# Patient Record
Sex: Female | Born: 1960
Health system: Southern US, Community
[De-identification: ages and names within clinical notes are randomized; demographics above are authoritative.]

## PROBLEM LIST (undated history)

## (undated) DIAGNOSIS — E785 Hyperlipidemia, unspecified: Secondary | ICD-10-CM

## (undated) DIAGNOSIS — T7840XA Allergy, unspecified, initial encounter: Secondary | ICD-10-CM

## (undated) DIAGNOSIS — G5600 Carpal tunnel syndrome, unspecified upper limb: Secondary | ICD-10-CM

## (undated) DIAGNOSIS — M1990 Unspecified osteoarthritis, unspecified site: Secondary | ICD-10-CM

## (undated) DIAGNOSIS — Z720 Tobacco use: Secondary | ICD-10-CM

## (undated) DIAGNOSIS — M797 Fibromyalgia: Secondary | ICD-10-CM

## (undated) DIAGNOSIS — F419 Anxiety disorder, unspecified: Secondary | ICD-10-CM

## (undated) DIAGNOSIS — F329 Major depressive disorder, single episode, unspecified: Secondary | ICD-10-CM

## (undated) DIAGNOSIS — E876 Hypokalemia: Secondary | ICD-10-CM

## (undated) DIAGNOSIS — N12 Tubulo-interstitial nephritis, not specified as acute or chronic: Secondary | ICD-10-CM

## (undated) DIAGNOSIS — F32A Depression, unspecified: Secondary | ICD-10-CM

## (undated) DIAGNOSIS — K219 Gastro-esophageal reflux disease without esophagitis: Secondary | ICD-10-CM

## (undated) HISTORY — DX: Depression, unspecified: F32.A

## (undated) HISTORY — DX: Anxiety disorder, unspecified: F41.9

## (undated) HISTORY — PX: LEG SURGERY: SHX1003

## (undated) HISTORY — PX: FRACTURE SURGERY: SHX138

## (undated) HISTORY — DX: Hypokalemia: E87.6

## (undated) HISTORY — DX: Fibromyalgia: M79.7

## (undated) HISTORY — DX: Tubulo-interstitial nephritis, not specified as acute or chronic: N12

## (undated) HISTORY — DX: Carpal tunnel syndrome, unspecified upper limb: G56.00

## (undated) HISTORY — DX: Hyperlipidemia, unspecified: E78.5

## (undated) HISTORY — DX: Gastro-esophageal reflux disease without esophagitis: K21.9

## (undated) HISTORY — DX: Allergy, unspecified, initial encounter: T78.40XA

## (undated) HISTORY — DX: Tobacco use: Z72.0

## (undated) HISTORY — DX: Unspecified osteoarthritis, unspecified site: M19.90

## (undated) HISTORY — DX: Major depressive disorder, single episode, unspecified: F32.9

---

## 1993-11-06 HISTORY — PX: HIP FRACTURE SURGERY: SHX118

## 2000-01-27 ENCOUNTER — Other Ambulatory Visit: Admission: RE | Admit: 2000-01-27 | Discharge: 2000-01-27 | Payer: Self-pay | Admitting: Family Medicine

## 2003-03-25 ENCOUNTER — Other Ambulatory Visit: Admission: RE | Admit: 2003-03-25 | Discharge: 2003-03-25 | Payer: Self-pay | Admitting: Family Medicine

## 2003-03-31 ENCOUNTER — Encounter: Payer: Self-pay | Admitting: Family Medicine

## 2003-03-31 ENCOUNTER — Ambulatory Visit (HOSPITAL_COMMUNITY): Admission: RE | Admit: 2003-03-31 | Discharge: 2003-03-31 | Payer: Self-pay | Admitting: Family Medicine

## 2004-11-06 DIAGNOSIS — N12 Tubulo-interstitial nephritis, not specified as acute or chronic: Secondary | ICD-10-CM

## 2004-11-06 HISTORY — DX: Tubulo-interstitial nephritis, not specified as acute or chronic: N12

## 2005-05-16 ENCOUNTER — Ambulatory Visit: Payer: Self-pay | Admitting: Family Medicine

## 2005-06-26 ENCOUNTER — Ambulatory Visit: Payer: Self-pay | Admitting: Family Medicine

## 2005-07-19 ENCOUNTER — Ambulatory Visit: Payer: Self-pay | Admitting: Internal Medicine

## 2005-07-19 ENCOUNTER — Inpatient Hospital Stay (HOSPITAL_COMMUNITY): Admission: EM | Admit: 2005-07-19 | Discharge: 2005-07-21 | Payer: Self-pay | Admitting: Emergency Medicine

## 2005-07-24 ENCOUNTER — Ambulatory Visit: Payer: Self-pay | Admitting: Family Medicine

## 2005-08-23 ENCOUNTER — Ambulatory Visit: Payer: Self-pay | Admitting: Family Medicine

## 2006-05-14 ENCOUNTER — Ambulatory Visit: Payer: Self-pay | Admitting: Family Medicine

## 2007-08-16 ENCOUNTER — Telehealth: Payer: Self-pay | Admitting: Family Medicine

## 2007-09-04 ENCOUNTER — Telehealth: Payer: Self-pay | Admitting: Family Medicine

## 2007-12-03 ENCOUNTER — Telehealth: Payer: Self-pay | Admitting: Family Medicine

## 2008-02-25 ENCOUNTER — Telehealth: Payer: Self-pay | Admitting: Family Medicine

## 2008-03-27 ENCOUNTER — Telehealth: Payer: Self-pay | Admitting: Family Medicine

## 2008-07-21 ENCOUNTER — Telehealth (INDEPENDENT_AMBULATORY_CARE_PROVIDER_SITE_OTHER): Payer: Self-pay | Admitting: *Deleted

## 2008-07-24 ENCOUNTER — Telehealth (INDEPENDENT_AMBULATORY_CARE_PROVIDER_SITE_OTHER): Payer: Self-pay | Admitting: *Deleted

## 2008-08-20 ENCOUNTER — Telehealth: Payer: Self-pay | Admitting: Family Medicine

## 2008-08-26 ENCOUNTER — Ambulatory Visit: Payer: Self-pay | Admitting: Family Medicine

## 2008-08-26 DIAGNOSIS — F329 Major depressive disorder, single episode, unspecified: Secondary | ICD-10-CM

## 2008-08-26 DIAGNOSIS — M797 Fibromyalgia: Secondary | ICD-10-CM | POA: Insufficient documentation

## 2008-08-26 DIAGNOSIS — F17200 Nicotine dependence, unspecified, uncomplicated: Secondary | ICD-10-CM | POA: Insufficient documentation

## 2008-08-26 DIAGNOSIS — F418 Other specified anxiety disorders: Secondary | ICD-10-CM | POA: Insufficient documentation

## 2008-08-26 DIAGNOSIS — R5382 Chronic fatigue, unspecified: Secondary | ICD-10-CM | POA: Insufficient documentation

## 2008-08-28 ENCOUNTER — Telehealth (INDEPENDENT_AMBULATORY_CARE_PROVIDER_SITE_OTHER): Payer: Self-pay | Admitting: *Deleted

## 2008-08-31 LAB — CONVERTED CEMR LAB
AST: 30 units/L (ref 0–37)
Basophils Absolute: 0 10*3/uL (ref 0.0–0.1)
CO2: 29 meq/L (ref 19–32)
Calcium: 9.7 mg/dL (ref 8.4–10.5)
Chloride: 104 meq/L (ref 96–112)
Eosinophils Absolute: 0.2 10*3/uL (ref 0.0–0.7)
GFR calc non Af Amer: 82 mL/min
Glucose, Bld: 122 mg/dL — ABNORMAL HIGH (ref 70–99)
Hemoglobin: 15.3 g/dL — ABNORMAL HIGH (ref 12.0–15.0)
Lymphocytes Relative: 15.7 % (ref 12.0–46.0)
MCHC: 34.9 g/dL (ref 30.0–36.0)
MCV: 91.4 fL (ref 78.0–100.0)
Neutro Abs: 10.9 10*3/uL — ABNORMAL HIGH (ref 1.4–7.7)
Potassium: 3.7 meq/L (ref 3.5–5.1)
RDW: 12.4 % (ref 11.5–14.6)
Sodium: 142 meq/L (ref 135–145)
TSH: 1.55 microintl units/mL (ref 0.35–5.50)
Total Bilirubin: 0.7 mg/dL (ref 0.3–1.2)

## 2008-09-16 ENCOUNTER — Telehealth: Payer: Self-pay | Admitting: Family Medicine

## 2008-10-05 ENCOUNTER — Ambulatory Visit: Payer: Self-pay | Admitting: Family Medicine

## 2008-10-05 LAB — CONVERTED CEMR LAB
Bilirubin Urine: NEGATIVE
Glucose, Urine, Semiquant: NEGATIVE
Ketones, urine, test strip: NEGATIVE
Specific Gravity, Urine: <1.005
pH: 6

## 2008-10-06 ENCOUNTER — Encounter: Payer: Self-pay | Admitting: Family Medicine

## 2008-10-07 ENCOUNTER — Telehealth: Payer: Self-pay | Admitting: Family Medicine

## 2008-10-15 ENCOUNTER — Encounter: Payer: Self-pay | Admitting: Family Medicine

## 2008-12-31 ENCOUNTER — Telehealth: Payer: Self-pay | Admitting: Family Medicine

## 2009-06-15 ENCOUNTER — Telehealth: Payer: Self-pay | Admitting: Family Medicine

## 2009-06-17 ENCOUNTER — Telehealth: Payer: Self-pay | Admitting: Family Medicine

## 2009-09-10 ENCOUNTER — Telehealth: Payer: Self-pay | Admitting: Family Medicine

## 2009-09-29 ENCOUNTER — Telehealth: Payer: Self-pay | Admitting: Family Medicine

## 2009-11-04 ENCOUNTER — Telehealth: Payer: Self-pay | Admitting: Family Medicine

## 2009-11-08 ENCOUNTER — Telehealth: Payer: Self-pay | Admitting: Family Medicine

## 2009-11-23 ENCOUNTER — Telehealth: Payer: Self-pay | Admitting: Family Medicine

## 2009-11-30 ENCOUNTER — Ambulatory Visit: Payer: Self-pay | Admitting: Family Medicine

## 2009-11-30 DIAGNOSIS — K589 Irritable bowel syndrome without diarrhea: Secondary | ICD-10-CM | POA: Insufficient documentation

## 2009-11-30 DIAGNOSIS — E785 Hyperlipidemia, unspecified: Secondary | ICD-10-CM | POA: Insufficient documentation

## 2009-12-03 ENCOUNTER — Encounter (INDEPENDENT_AMBULATORY_CARE_PROVIDER_SITE_OTHER): Payer: Self-pay | Admitting: *Deleted

## 2009-12-03 ENCOUNTER — Telehealth: Payer: Self-pay | Admitting: Family Medicine

## 2009-12-03 LAB — CONVERTED CEMR LAB
AST: 27 units/L (ref 0–37)
Albumin: 4 g/dL (ref 3.5–5.2)
BUN: 5 mg/dL — ABNORMAL LOW (ref 6–23)
Basophils Absolute: 0.1 10*3/uL (ref 0.0–0.1)
CO2: 33 meq/L — ABNORMAL HIGH (ref 19–32)
Calcium: 9.5 mg/dL (ref 8.4–10.5)
Eosinophils Absolute: 0.2 10*3/uL (ref 0.0–0.7)
GFR calc non Af Amer: 114.03 mL/min (ref >60)
Glucose, Bld: 89 mg/dL (ref 70–99)
HCT: 43.7 % (ref 36.0–46.0)
HDL: 43.1 mg/dL (ref >39.00)
Hemoglobin: 14.6 g/dL (ref 12.0–15.0)
Lymphocytes Relative: 21.9 % (ref 12.0–46.0)
Lymphs Abs: 2.3 10*3/uL (ref 0.7–4.0)
MCHC: 33.3 g/dL (ref 30.0–36.0)
Monocytes Relative: 5.6 % (ref 3.0–12.0)
Neutro Abs: 7.2 10*3/uL (ref 1.4–7.7)
Platelets: 346 10*3/uL (ref 150.0–400.0)
RDW: 12.1 % (ref 11.5–14.6)
TSH: 1.37 microintl units/mL (ref 0.35–5.50)
Total Bilirubin: 0.6 mg/dL (ref 0.3–1.2)
Triglycerides: 258 mg/dL — ABNORMAL HIGH (ref 0.0–149.0)

## 2009-12-20 ENCOUNTER — Ambulatory Visit: Payer: Self-pay | Admitting: Family Medicine

## 2009-12-20 DIAGNOSIS — E876 Hypokalemia: Secondary | ICD-10-CM | POA: Insufficient documentation

## 2009-12-21 ENCOUNTER — Encounter: Payer: Self-pay | Admitting: Family Medicine

## 2009-12-21 LAB — CONVERTED CEMR LAB: Potassium: 3.1 meq/L — ABNORMAL LOW (ref 3.5–5.1)

## 2010-01-05 ENCOUNTER — Ambulatory Visit: Payer: Self-pay | Admitting: Family Medicine

## 2010-01-07 LAB — CONVERTED CEMR LAB: Potassium: 4.1 meq/L (ref 3.5–5.1)

## 2010-01-26 ENCOUNTER — Telehealth: Payer: Self-pay | Admitting: Family Medicine

## 2010-03-30 ENCOUNTER — Ambulatory Visit: Payer: Self-pay | Admitting: Family Medicine

## 2010-03-30 ENCOUNTER — Telehealth: Payer: Self-pay | Admitting: Family Medicine

## 2010-03-30 LAB — CONVERTED CEMR LAB
AST: 28 units/L (ref 0–37)
Cholesterol: 295 mg/dL — ABNORMAL HIGH (ref 0–200)
Direct LDL: 207.6 mg/dL

## 2010-04-08 ENCOUNTER — Ambulatory Visit: Payer: Self-pay | Admitting: Family Medicine

## 2010-05-20 ENCOUNTER — Ambulatory Visit: Payer: Self-pay | Admitting: Family Medicine

## 2010-05-24 LAB — CONVERTED CEMR LAB
Albumin: 4 g/dL (ref 3.5–5.2)
BUN: 9 mg/dL (ref 6–23)
CO2: 28 meq/L (ref 19–32)
Calcium: 9.1 mg/dL (ref 8.4–10.5)
Chloride: 109 meq/L (ref 96–112)
Cholesterol: 195 mg/dL (ref 0–200)
HDL: 38.5 mg/dL — ABNORMAL LOW (ref >39.00)
Phosphorus: 3 mg/dL (ref 2.3–4.6)
Potassium: 4 meq/L (ref 3.5–5.1)
Triglycerides: 161 mg/dL — ABNORMAL HIGH (ref 0.0–149.0)
VLDL: 32.2 mg/dL (ref 0.0–40.0)

## 2010-05-30 ENCOUNTER — Emergency Department (HOSPITAL_COMMUNITY): Admission: EM | Admit: 2010-05-30 | Discharge: 2010-05-30 | Payer: Self-pay | Admitting: Emergency Medicine

## 2010-06-27 ENCOUNTER — Telehealth: Payer: Self-pay | Admitting: Family Medicine

## 2010-07-25 ENCOUNTER — Telehealth: Payer: Self-pay | Admitting: Family Medicine

## 2010-08-23 ENCOUNTER — Telehealth: Payer: Self-pay | Admitting: Family Medicine

## 2010-10-07 ENCOUNTER — Ambulatory Visit: Payer: Self-pay | Admitting: Family Medicine

## 2010-10-10 LAB — CONVERTED CEMR LAB
ALT: 28 units/L (ref 0–35)
AST: 24 units/L (ref 0–37)
Albumin: 4.2 g/dL (ref 3.5–5.2)
Alkaline Phosphatase: 73 units/L (ref 39–117)
BUN: 7 mg/dL (ref 6–23)
Basophils Relative: 0.2 % (ref 0.0–3.0)
CO2: 29 meq/L (ref 19–32)
Chloride: 101 meq/L (ref 96–112)
Direct LDL: 137.9 mg/dL
Eosinophils Absolute: 0.4 10*3/uL (ref 0.0–0.7)
Eosinophils Relative: 3.1 % (ref 0.0–5.0)
Glucose, Bld: 95 mg/dL (ref 70–99)
HDL: 41.2 mg/dL (ref >39.00)
Hemoglobin: 13.9 g/dL (ref 12.0–15.0)
Lymphocytes Relative: 21.3 % (ref 12.0–46.0)
MCHC: 34.3 g/dL (ref 30.0–36.0)
Neutro Abs: 8.6 10*3/uL — ABNORMAL HIGH (ref 1.4–7.7)
Potassium: 4.4 meq/L (ref 3.5–5.1)
RBC: 4.5 M/uL (ref 3.87–5.11)
TSH: 1.39 microintl units/mL (ref 0.35–5.50)
Total CHOL/HDL Ratio: 5
Triglycerides: 166 mg/dL — ABNORMAL HIGH (ref 0.0–149.0)
VLDL: 33.2 mg/dL (ref 0.0–40.0)
WBC: 12.3 10*3/uL — ABNORMAL HIGH (ref 4.5–10.5)

## 2010-10-11 ENCOUNTER — Ambulatory Visit: Payer: Self-pay | Admitting: Family Medicine

## 2010-11-01 ENCOUNTER — Ambulatory Visit: Payer: Self-pay | Admitting: Family Medicine

## 2010-11-01 DIAGNOSIS — D72829 Elevated white blood cell count, unspecified: Secondary | ICD-10-CM | POA: Insufficient documentation

## 2010-11-04 LAB — CONVERTED CEMR LAB
Basophils Relative: 0.3 % (ref 0.0–3.0)
Eosinophils Absolute: 0.4 10*3/uL (ref 0.0–0.7)
HCT: 40.5 % (ref 36.0–46.0)
Lymphs Abs: 3.1 10*3/uL (ref 0.7–4.0)
MCHC: 34.2 g/dL (ref 30.0–36.0)
MCV: 90.8 fL (ref 78.0–100.0)
Monocytes Absolute: 0.8 10*3/uL (ref 0.1–1.0)
Neutrophils Relative %: 60.3 % (ref 43.0–77.0)
RBC: 4.46 M/uL (ref 3.87–5.11)

## 2010-12-06 NOTE — Assessment & Plan Note (Signed)
Summary: FOLLOW UP   Vital Signs:  Patient profile:   50 year old female Height:      68.75 inches Weight:      190.75 pounds BMI:     28.48 Temp:     97.8 degrees F oral Pulse rate:   88 / minute Pulse rhythm:   regular BP sitting:   120 / 76  (left arm) Cuff size:   regular  Vitals Entered By: Lewanda Rife LPN (April 08, 980 9:12 AM) CC: follow-up visit   History of Present Illness: wt is up 4 lb with bmi of 28  overall is feeling the same   cholesterol is way up -- with LDL of 207.6 and trig 279 last LDL 179 and was asked to watch diet  diet is not better -- because she is intolerant of a lot of foods  has been eating a lot of chicken - that is good  eats some fruit  some veg -- mainly peas/ lima beans -- no other greens -- bother stomach and she does not like them   she likes meat and potatoes and bread    fibro-- no worse or better   tab-- smoking the same - about 1ppd or less  smoking only outside  thinks it would be nice to quit - but not ready   K is better after suppl   tends to get dizzy if she stands up to quick or gets hot  no loc   mood has been pretty good overall      Allergies: 1)  ! Vioxx 2)  ! * Bextra 3)  ! Voltaren 4)  ! Ibuprofen 5)  ! * Aleve  Past History:  Past Medical History: Last updated: 08/26/2008 fibromyalgia  tabacco abuse depression gerd carpal tunnel syndrome hyperlipidemia hypokalemia   ortho [place] [facility]- [facility]   Past Surgical History: Last updated: 08/26/2008 leg fx - at age 72 with screws placed 1995 L hip fx 06 hosp for pyellonephritis   Family History: Last updated: 08/26/2008 father arthritis, obesity, ? colitis PGF MI Puncle MI  aunt DM MGM DM  niece with DM cousin with leukemia   Social History: Last updated: 08/26/2008 has same sex partner  current smoker disabled from work due to fibromyalgia   Review of Systems General:  Complains of fatigue; denies fever,  loss of appetite, and malaise. Eyes:  Denies blurring and eye irritation. CV:  Denies chest pain or discomfort, palpitations, shortness of breath with exertion, and swelling of feet. Resp:  Denies cough, shortness of breath, and wheezing. GI:  Denies abdominal pain and change in bowel habits. GU:  Denies urinary frequency. MS:  Complains of joint pain, muscle aches, and stiffness; denies joint redness, joint swelling, and muscle weakness. Derm:  Denies itching, lesion(s), poor wound healing, and rash. Neuro:  Denies numbness and tingling. Psych:  mood is fairly stable . Endo:  Denies cold intolerance, excessive thirst, excessive urination, and heat intolerance. Heme:  Denies abnormal bruising and bleeding.  Physical Exam  General:  overweight but generally well appearing   Head:  normocephalic, atraumatic, and no abnormalities observed.   Eyes:  vision grossly intact, pupils equal, pupils round, and pupils reactive to light.  no conjunctival pallor, injection or icterus  Mouth:  pharynx pink and moist.   Neck:  supple with full rom and no masses or thyromegally, no JVD or carotid bruit  Lungs:  diffusely distant bs - without rales or rhonchi  scant wheeze on forced exp only no prolonged exp phase Heart:  Normal rate and regular rhythm. S1 and S2 normal without gallop, murmur, click, rub or other extra sounds. Msk:  No deformity or scoliosis noted of thoracic or lumbar spine.  trigger points diffusely  limited rom SL  Pulses:  R and L carotid,radial,femoral,dorsalis pedis and posterior tibial pulses are full and equal bilaterally Extremities:  No clubbing, cyanosis, edema, or deformity noted with normal full range of motion of all joints.   Neurologic:  sensation intact to light touch, gait normal, and DTRs symmetrical and normal.  no tremor Skin:  Intact without suspicious lesions or rashes Cervical Nodes:  No lymphadenopathy noted Psych:  affect nl - is relatively cheerful  today   Impression & Recommendations:  Problem # 1:  HYPOKALEMIA (ICD-276.8) Assessment Improved  better with supplement re check at 6 wk labs   Orders: Prescription Created Electronically [phone])  Problem # 2:  HYPERLIPIDEMIA (ICD-272.4) Assessment: Deteriorated  this is severe and disc vascular risks with this in combo with smoking pt is unsure she is willing to change her diet  will start zocor 20-- may need to inc after that  update if side eff rev low sat fat diet in detail  lab 6 wk f/u 6 mo  Her updated medication list for this problem includes:    Zocor 20 Mg Tabs (Simvastatin) .Marland Kitchen... 1 by mouth once daily in evening  Labs Reviewed: SGOT: 28 (03/30/2010)   SGPT: 30 (03/30/2010)   HDL:45.40 (03/30/2010), 43.10 (11/30/2009)  Chol:295 (03/30/2010), 256 (11/30/2009)  Trig:279.0 (03/30/2010), 258.0 (11/30/2009)  Orders: Prescription Created Electronically [phone])  Problem # 3:  TOBACCO USE (ICD-305.1) Assessment: Unchanged  discussed in detail risks of smoking, and possible outcomes including COPD, vascular dz, cancer and also respiratory infections/sinus problems  pt is not ready to quit yet - even after disc risks above  will let me know when ready to quit   Orders: Prescription Created Electronically [phone])  Problem # 4:  FIBROMYALGIA (ICD-729.1) Assessment: Unchanged no changes -- overall enc some physical activity as tolerated  no change in medication Her updated medication list for this problem includes:    Ultram 50 Mg Tabs (Tramadol hcl) .Marland Kitchen... 1 by mouth two times a day as needed    Flexeril 10 Mg Tabs (Cyclobenzaprine hcl) .Marland Kitchen... 1 by mouth three times a day as needed  Orders: Prescription Created Electronically [phone])  Complete Medication List: 1)  Ultram 50 Mg Tabs (Tramadol hcl) .Marland Kitchen.. 1 by mouth two times a day as needed 2)  Flexeril 10 Mg Tabs (Cyclobenzaprine hcl) .Marland Kitchen.. 1 by mouth three times a day as needed 3)  Zoloft 100 Mg Tabs (Sertraline  hcl) .Marland Kitchen.. 11/2 by mouth once daily 4)  Omeprazole 20 Mg Cpdr (Omeprazole) .Marland Kitchen.. 1 by mouth  each am 5)  Potassium Chloride Cr 10 Meq Cr-caps (Potassium chloride) .... Take two by mouth daily 6)  Zocor 20 Mg Tabs (Simvastatin) .Marland Kitchen.. 1 by mouth once daily in evening  Patient Instructions: 1)  you can raise your HDL (good cholesterol) by increasing exercise and eating omega 3 fatty acid supplement like fish oil or flax seed oil over the counter 2)  you can lower LDL (bad cholesterol) by limiting saturated fats in diet like red meat, fried foods, egg yolks, fatty breakfast meats, high fat dairy products and shellfish  3)  try to be as active as possilbe  4)  take the zocor 20 mg in evening  5)  if any side eff or problems -- stop med and call  6)  schedule fasting labs in 6 weeks lipid/ast/alt/K 272, renal  7)  follow up with me in about 6 months  Prescriptions: ZOCOR 20 MG TABS (SIMVASTATIN) 1 by mouth once daily in evening  #30 x 11   Entered and Authorized by:   Judith Part MD   Signed by:   Judith Part MD on 04/08/2010   Method used:   [facility]        [facility] [address] [phone]* (retail)       [address]       [place], [place]  [address]       Ph: [phone] or [phone]       Fax: [phone]   RxID:   [id number]   Current Allergies (reviewed today): ! VIOXX ! * BEXTRA ! VOLTAREN ! IBUPROFEN ! * ALEVE

## 2010-12-06 NOTE — Progress Notes (Signed)
Summary: Tramadol HCL 50mg  rx  Phone Note Refill Request Call back at [phone] Message [facility] on July 25, 2010 4:26 PM  Refills Requested: Medication #1:  ULTRAM 50 MG  TABS 1 by mouth two times a day as needed [facility] [address] request Tramadol 50mg  refill. No refill date sent.Please advise.    Method Requested: Telephone to Pharmacy Initial call taken by: Lewanda Rife LPN,  July 25, 2010 4:26 PM  Follow-up for Phone Call        px written on EMR for call in  Follow-up by: Judith Part MD,  July 25, 2010 4:49 PM  Additional Follow-up for Phone Call Additional follow up Details #1::        [facility] as instructed. Lewanda Rife LPN  July 25, 2010 4:56 PM     New/Updated Medications: ULTRAM 50 MG  TABS (TRAMADOL HCL) 1 by mouth two times a day as needed Prescriptions: ULTRAM 50 MG  TABS (TRAMADOL HCL) 1 by mouth two times a day as needed  #60 x 5   Entered and Authorized by:   Judith Part MD   Signed by:   Lewanda Rife LPN on 21/30/8657   Method used:   Telephoned to ...         RxID:   [id number]

## 2010-12-06 NOTE — Progress Notes (Signed)
Summary: Flexeril refill  Phone Note Refill Request Call back at [phone] ([facility]) Message from:  [facility] on June 27, 2010 11:57 AM  Refills Requested: Medication #1:  FLEXERIL 10 MG  TABS 1 by mouth three times a day as needed Ok to refill Flexeril 10mg  1 by mouth three times a day as needed #90?   Method Requested: Electronic Initial call taken by: Janee Morn CMA Duncan Dull),  June 27, 2010 11:58 AM  Follow-up for Phone Call        px written on EMR for call in  Follow-up by: Judith Part MD,  June 27, 2010 1:22 PM  Additional Follow-up for Phone Call Additional follow up Details #1::        [facility] Additional Follow-up by: Janee Morn CMA [facility]ull),  June 27, 2010 1:36 PM    Prescriptions: FLEXERIL 10 MG  TABS (CYCLOBENZAPRINE HCL) 1 by mouth three times a day as needed  #90 x 1   Entered and Authorized by:   Judith Part MD   Signed by:   Judith Part MD on 06/27/2010   Method used:   Telephoned to ...         RxID:   [id number]

## 2010-12-06 NOTE — Progress Notes (Signed)
Summary: Cyclobenzaprine  10mg   Phone Note Refill Request   Refills Requested: Medication #1:  FLEXERIL 10 MG  TABS 1 by mouth three times a day as needed [facility] [address] request refill on Cyclobenzaprine 10mg . No refill date sent. [facility] and [facility] the #90 plus extra refill given on 06/27/10.Please advise.    Method Requested: Telephone to Pharmacy Initial call taken by: Lewanda Rife LPN,  August 23, 2010 3:54 PM  Follow-up for Phone Call        px written on EMR for call in  Follow-up by: Judith Part MD,  August 23, 2010 4:03 PM  Additional Follow-up for Phone Call Additional follow up Details #1::        Rx called to pharmacy Additional Follow-up by: Linde Gillis CMA [facility]),  August 23, 2010 4:09 PM    Prescriptions: FLEXERIL 10 MG  TABS (CYCLOBENZAPRINE HCL) 1 by mouth three times a day as needed  #90 x 2   Entered by:   Linde Gillis CMA (AAMA)   Authorized by:   Judith Part MD   Signed by:   Linde Gillis CMA [facility] Dull) on 08/23/2010   Method used:   Electronically to        [facility] [address] [phone]* (retail)       [address]       [place], [place]  [address]       Ph: [phone] or [phone]       Fax: [phone]   RxID:   [id number]

## 2010-12-06 NOTE — Progress Notes (Signed)
Summary: refill request for flexeril  Phone Note Refill Request Message from:  Fax from Pharmacy  Refills Requested: Medication #1:  FLEXERIL 10 MG  TABS 1 by mouth three times a day prn   Last Refilled: 10/11/2009 [facility] phone [phone].  Initial call taken by: Lowella Petties CMA,  December 03, 2009 10:50 AM  Follow-up for Phone Call        Medication phoned to pharmacy.  Follow-up by: Delilah Shan CMA [facility] Dull),  December 03, 2009 12:32 PM    Prescriptions: FLEXERIL 10 MG  TABS (CYCLOBENZAPRINE HCL) 1 by mouth three times a day prn  #90 Tablet x 0   Entered and Authorized by:   Ruthe Mannan MD   Signed by:   Ruthe Mannan MD on 12/03/2009   Method used:   Handwritten   RxID:   [id number]

## 2010-12-06 NOTE — Miscellaneous (Signed)
Summary: Controlled Substance Agreement  Controlled Substance Agreement   Imported By: Lanelle Bal 04/14/2010 09:05:47  _____________________________________________________________________  External Attachment:    Type:   Image     Comment:   External Document

## 2010-12-06 NOTE — Progress Notes (Signed)
Summary: Tramadol HCL 50mg   Phone Note Refill Request Call back at [phone] Message from:  [facility] [address] on January 26, 2010 11:03 AM  Refills Requested: Medication #1:  ULTRAM 50 MG  TABS 1 by mouth two times a day as needed [facility] electronic refill on Tramadol HCL 50mg . No date sent for last refill. Please advise.    Method Requested: Telephone to Pharmacy Initial call taken by: Lewanda Rife LPN,  January 26, 2010 11:03 AM  Follow-up for Phone Call        px written on EMR for call in  Follow-up by: Judith Part MD,  January 26, 2010 11:56 AM  Additional Follow-up for Phone Call Additional follow up Details #1::        [facility] as instructed. Lewanda Rife LPN  January 26, 2010 1:13 PM     New/Updated Medications: ULTRAM 50 MG  TABS (TRAMADOL HCL) 1 by mouth two times a day as needed Prescriptions: ULTRAM 50 MG  TABS (TRAMADOL HCL) 1 by mouth two times a day as needed  #60 x 5   Entered and Authorized by:   Judith Part MD   Signed by:   Lewanda Rife LPN on 40/98/1191   Method used:   Telephoned to ...         RxID:   [id number]

## 2010-12-06 NOTE — Assessment & Plan Note (Signed)
Summary: FOLLOW UP / LFW  R/S 12/5   Vital Signs:  Patient profile:   50 year old female Height:      68.75 inches Weight:      197 pounds BMI:     29.41 Temp:     98.2 degrees F oral Pulse rate:   96 / minute Pulse rhythm:   regular BP sitting:   114 / 74  (left arm) Cuff size:   regular  Vitals Entered By: Lewanda Rife LPN (October 07, 2010 9:25 AM) CC: six month f/u   History of Present Illness: here for f/u of hyperlipidemia and depression and fibromyalgia/ chronic fatigue   in general doing about the same  wt is up 7 lb with bmi of 29  bp good 114/74  smoking status-- still smoking -- cut down to 1/2 ppd  not serious about a quit date  worried she will gain weight    chol did improve much with low dose zocor  due for a check diet -- more chicken and avoiding red meat  no fried foods at all   mood has been pretty good is taking care of her grandmother on sunday --- needs a lot of patience   pain-- is still sore all over - more persistant than it was  is trying to do some stretching  did injure R ankle after stretching -- and that is better thumbs hurt a lot   some exercise when she can -- inside the house -- cleaning    wants her flu shot     Allergies: 1)  ! Vioxx 2)  ! * Bextra 3)  ! Voltaren 4)  ! Ibuprofen 5)  ! * Aleve  Past History:  Past Medical History: Last updated: 08/26/2008 fibromyalgia  tabacco abuse depression gerd carpal tunnel syndrome hyperlipidemia hypokalemia   ortho [place] [facility]- [facility]   Past Surgical History: Last updated: 08/26/2008 leg fx - at age 66 with screws placed 1995 L hip fx 06 hosp for pyellonephritis   Family History: Last updated: 08/26/2008 father arthritis, obesity, ? colitis PGF MI Puncle MI  aunt DM MGM DM  niece with DM cousin with leukemia   Social History: Last updated: 08/26/2008 has same sex partner  current smoker disabled from work due to  fibromyalgia   Review of Systems General:  Complains of fatigue and sleep disorder; denies chills, fever, and loss of appetite. Eyes:  Denies blurring and eye irritation. CV:  Denies chest pain or discomfort, lightheadness, palpitations, and shortness of breath with exertion. Resp:  Denies cough, shortness of breath, and wheezing. GI:  Denies abdominal pain, change in bowel habits, indigestion, and nausea. GU:  Denies dysuria and urinary frequency. MS:  Denies joint pain and joint swelling. Derm:  Denies itching, lesion(s), poor wound healing, and rash. Neuro:  Denies numbness and tingling. Psych:  Denies anxiety and depression. Endo:  Denies excessive thirst and excessive urination. Heme:  Denies abnormal bruising and bleeding.  Physical Exam  General:  overweight but generally well appearing   Head:  normocephalic, atraumatic, and no abnormalities observed.   Eyes:  vision grossly intact, pupils equal, pupils round, and pupils reactive to light.  no conjunctival pallor, injection or icterus  Mouth:  pharynx pink and moist.   Neck:  supple with full rom and no masses or thyromegally, no JVD or carotid bruit  Chest Wall:  No deformities, masses, or tenderness noted. Lungs:  diffusely distant bs - without  rales or rhonchi scant wheeze on forced exp only no prolonged exp phase Heart:  Normal rate and regular rhythm. S1 and S2 normal without gallop, murmur, click, rub or other extra sounds. Abdomen:  Bowel sounds positive,abdomen soft and non-tender without masses, organomegaly or hernias noted. no renal bruits  Msk:  No deformity or scoliosis noted of thoracic or lumbar spine.  trigger points diffusely  limited rom SL gait is not labored today  Pulses:  R and L carotid,radial,femoral,dorsalis pedis and posterior tibial pulses are full and equal bilaterally Extremities:  No clubbing, cyanosis, edema, or deformity noted with normal full range of motion of all joints.   Neurologic:   sensation intact to light touch, gait normal, and DTRs symmetrical and normal.   Skin:  Intact without suspicious lesions or rashes Cervical Nodes:  No lymphadenopathy noted Inguinal Nodes:  No significant adenopathy Psych:  affect is brighter than usual- talkative  good eye contact/comm skills and sense of humor   Impression & Recommendations:  Problem # 1:  HYPERLIPIDEMIA (ICD-272.4) Assessment Improved  improved on zocor and with better diet lab today rev low sat fat diet and risks of high chol Her updated medication list for this problem includes:    Zocor 20 Mg Tabs (Simvastatin) .Marland Kitchen... 1 by mouth once daily in evening  Orders: Venipuncture (56433) TLB-BMP (Basic Metabolic Panel-BMET) (80048-METABOL) TLB-TSH (Thyroid Stimulating Hormone) (84443-TSH) TLB-Hepatic/Liver Function Pnl (80076-HEPATIC) TLB-CBC Platelet - w/Differential (85025-CBCD) TLB-ALT (SGPT) (84460-ALT) TLB-AST (SGOT) (84450-SGOT) TLB-Lipid Panel (80061-LIPID) Prescription Created Electronically [phone])  Labs Reviewed: SGOT: 20 (05/20/2010)   SGPT: 24 (05/20/2010)   HDL:38.50 (05/20/2010), 45.40 (03/30/2010)  LDL:124 (05/20/2010)  Chol:195 (05/20/2010), 295 (03/30/2010)  Trig:161.0 (05/20/2010), 279.0 (03/30/2010)  Problem # 2:  HYPOKALEMIA (ICD-276.8) Assessment: Unchanged lab today on supplementation Orders: Venipuncture (84166) TLB-BMP (Basic Metabolic Panel-BMET) (80048-METABOL) TLB-TSH (Thyroid Stimulating Hormone) (84443-TSH) TLB-Hepatic/Liver Function Pnl (80076-HEPATIC) TLB-CBC Platelet - w/Differential (85025-CBCD) TLB-ALT (SGPT) (84460-ALT) TLB-AST (SGOT) (84450-SGOT) TLB-Lipid Panel (80061-LIPID) Prescription Created Electronically [phone])  Problem # 3:  DEPRESSION (ICD-311) Assessment: Unchanged  mood is ok on zoloft - good attitude will continue this dose  recommended exercise Her updated medication list for this problem includes:    Zoloft 100 Mg Tabs (Sertraline hcl) .Marland Kitchen... 11/2  by mouth once daily  Orders: Prescription Created Electronically [phone])  Problem # 4:  FIBROMYALGIA (ICD-729.1) Assessment: Unchanged  this is stable  enc to call integrative therapies and see if they take her ins may benefit from some PT and massage Her updated medication list for this problem includes:    Ultram 50 Mg Tabs (Tramadol hcl) .Marland Kitchen... 1 by mouth two times a day as needed    Flexeril 10 Mg Tabs (Cyclobenzaprine hcl) .Marland Kitchen... 1 by mouth three times a day as needed  Orders: Prescription Created Electronically [phone])  Problem # 5:  FATIGUE (ICD-780.79) Assessment: Unchanged stable- chronic lab today Orders: Venipuncture (73220) TLB-BMP (Basic Metabolic Panel-BMET) (80048-METABOL) TLB-TSH (Thyroid Stimulating Hormone) (84443-TSH) TLB-Hepatic/Liver Function Pnl (80076-HEPATIC) TLB-CBC Platelet - w/Differential (85025-CBCD) TLB-ALT (SGPT) (84460-ALT) TLB-AST (SGOT) (84450-SGOT) TLB-Lipid Panel (80061-LIPID) Prescription Created Electronically [phone])  Problem # 6:  TOBACCO USE (ICD-305.1) Assessment: Unchanged  discussed in detail risks of smoking, and possible outcomes including COPD, vascular dz, cancer and also respiratory infections/sinus problems   flu shot today enc to consider quitting   Orders: Prescription Created Electronically [phone])  Complete Medication List: 1)  Ultram 50 Mg Tabs (Tramadol hcl) .Marland Kitchen.. 1 by mouth two times a day as needed 2)  Flexeril 10 Mg  Tabs (Cyclobenzaprine hcl) .Marland Kitchen.. 1 by mouth three times a day as needed 3)  Zoloft 100 Mg Tabs (Sertraline hcl) .Marland Kitchen.. 11/2 by mouth once daily 4)  Omeprazole 20 Mg Cpdr (Omeprazole) .Marland Kitchen.. 1 by mouth  each am 5)  Potassium Chloride Cr 10 Meq Cr-caps (Potassium chloride) .... Take two by mouth daily 6)  Zocor 20 Mg Tabs (Simvastatin) .Marland Kitchen.. 1 by mouth once daily in evening  Other Orders: Admin 1st Vaccine (13086) Flu Vaccine 52yrs + [phone])  Patient Instructions: 1)  Shirlee Limerick- please give pt phone number  to integrative therapies in [place] -- so she can call and get some info 2)  call us if you want a referral  3)  no change in meds  4)  labs today  5)  keep up the better diet 6)  keep thinking about quitting smoking 7)  consider some low impact exercise in the house -- perhaps some videos  8)  flu shot today Prescriptions: ZOCOR 20 MG TABS (SIMVASTATIN) 1 by mouth once daily in evening  #30 x 11   Entered and Authorized by:   Judith Part MD   Signed by:   Judith Part MD on 10/07/2010   Method used:   [facility]        [facility] [address] [phone]* (retail)       [address]       [place], [place]  [address]       Ph: [phone] or [phone]       Fax: [phone]   RxID:   [id number] POTASSIUM CHLORIDE CR 10 MEQ CR-CAPS (POTASSIUM CHLORIDE) take two by mouth daily  #60 x 11   Entered and Authorized by:   Judith Part MD   Signed by:   Judith Part MD on 10/07/2010   Method used:   Electronically to        [facility] [address] [phone]* (retail)       [address]       [place], [place]  [address]       Ph: [phone] or [phone]       Fax: [phone]   RxID:   [id number] OMEPRAZOLE 20 MG CPDR (OMEPRAZOLE) 1 by mouth  each am  #30 x 11   Entered and Authorized by:   Judith Part MD   Signed by:   Judith Part MD on 10/07/2010   Method used:   Electronically to        [facility] [address] [phone]* (retail)       [address]       [place], [place]  [address]       Ph: [phone] or [phone]       Fax: [phone]   RxID:   [id number] ZOLOFT 100 MG  TABS (SERTRALINE HCL) 11/2 by mouth once daily  #45 x 11   Entered and Authorized by:   Judith Part MD   Signed by:   Judith Part MD on 10/07/2010   Method used:   Electronically to        [facility] [address] [phone]* (retail)       [address]       [place], [place]  [address]  Ph: [phone] or [phone]       Fax: [phone]   RxID:   [id number] FLEXERIL 10 MG  TABS (CYCLOBENZAPRINE HCL) 1 by mouth three times a day as needed  #90 x 5   Entered and Authorized by:   Judith Part MD   Signed by:   Judith Part MD on 10/07/2010   Method used:   Electronically to        [facility] [address] [phone]* (retail)       [address]       [place], [place]  [address]       Ph: [phone] or [phone]       Fax: [phone]   RxID:   [id number]    Orders Added: 1)  Venipuncture [85462] 2)  TLB-BMP (Basic Metabolic Panel-BMET) [80048-METABOL] 3)  TLB-TSH (Thyroid Stimulating Hormone) [84443-TSH] 4)  TLB-Hepatic/Liver Function Pnl [80076-HEPATIC] 5)  TLB-CBC Platelet - w/Differential [85025-CBCD] 6)  TLB-ALT (SGPT) [84460-ALT] 7)  TLB-AST (SGOT) [84450-SGOT] 8)  TLB-Lipid Panel [80061-LIPID] 9)  Admin 1st Vaccine [90471] 10)  Flu Vaccine 46yrs + [70350] 11)  Est. Patient Level IV [09381] 12)  Prescription Created Electronically [W2993]    Current Allergies (reviewed today): ! VIOXX ! * BEXTRA ! VOLTAREN ! IBUPROFEN ! * ALEVE  Flu Vaccine Consent Questions     Do you have a history of severe allergic reactions to this vaccine? no    Any prior history of allergic reactions to egg and/or gelatin? no    Do you have a sensitivity to the preservative Thimersol? no    Do you have a past history of Guillan-Barre Syndrome? no    Do you currently have an acute febrile illness? no    Have you ever had a severe reaction to latex? no    Vaccine information given and explained to patient? yes    Are you currently pregnant? no    Lot Number:[id number]   Exp Date:05/06/2011   Site Given  Left Deltoid IM Lewanda Rife LPN  October 07, 2010 10:14 AM  .Craige Cotta

## 2010-12-06 NOTE — Progress Notes (Signed)
Summary: Rx Flexeril  Phone Note Refill Request Call back at [phone] Message from:  [facility] on Mar 30, 2010 3:11 PM  Refills Requested: Medication #1:  FLEXERIL 10 MG  TABS 1 by mouth three times a day prn Received E-script request please advise.   Method Requested: Electronic Initial call taken by: Linde Gillis CMA Duncan Dull),  Mar 30, 2010 3:11 PM  Follow-up for Phone Call        px written on EMR for call in  Follow-up by: Judith Part MD,  Mar 30, 2010 5:16 PM  Additional Follow-up for Phone Call Additional follow up Details #1::        Medication [facility] pharmacy as instructed. Lewanda Rife LPN  Mar 30, 2010 5:22 PM     New/Updated Medications: FLEXERIL 10 MG  TABS (CYCLOBENZAPRINE HCL) 1 by mouth three times a day prn Prescriptions: FLEXERIL 10 MG  TABS (CYCLOBENZAPRINE HCL) 1 by mouth three times a day prn  #90 x 2   Entered and Authorized by:   Judith Part MD   Signed by:   Lewanda Rife LPN on 54/07/8118   Method used:   Telephoned to ...       [facility] [address] [phone]* (retail)       [address]       [place], [place]  [address]       Ph: [phone] or [phone]       Fax: [phone]   RxID:   [id number]

## 2010-12-06 NOTE — Progress Notes (Signed)
Summary: Rx Tramadol  Phone Note Refill Request Call back at [phone] Message from:  [facility] on November 23, 2009 4:56 PM  Refills Requested: Medication #1:  ULTRAM 50 MG  TABS 1 by mouth two times a day as needed   Last Refilled: 10/23/2009 Received faxed refill request, please advise   Method Requested: Telephone to Pharmacy Initial call taken by: Linde Gillis CMA Duncan Dull),  November 23, 2009 4:57 PM  Follow-up for Phone Call        px written on EMR for call in please schedule f/u to check in this winter if not already planned   Follow-up by: Judith Part MD,  November 23, 2009 5:22 PM  Additional Follow-up for Phone Call Additional follow up Details #1::        [facility]             Lowella Petties CMA  November 23, 2009 5:35 PM  Appt made for next week. Additional Follow-up by: Lowella Petties CMA,  November 24, 2009 9:21 AM    Prescriptions: ULTRAM 50 MG  TABS (TRAMADOL HCL) 1 by mouth two times a day as needed  #60 x 1   Entered and Authorized by:   Judith Part MD   Signed by:   Judith Part MD on 11/23/2009   Method used:   Telephoned to ...         RxID:   [id number]

## 2010-12-06 NOTE — Miscellaneous (Signed)
Summary: Potassium rx  Medications Added POTASSIUM CHLORIDE CR 10 MEQ CR-CAPS (POTASSIUM CHLORIDE) take two by mouth daily       Clinical Lists Changes  Medications: Changed medication from POTASSIUM CHLORIDE CR 10 MEQ CR-CAPS (POTASSIUM CHLORIDE) take one by mouth daily to POTASSIUM CHLORIDE CR 10 MEQ CR-CAPS (POTASSIUM CHLORIDE) take two by mouth daily - Signed Rx of POTASSIUM CHLORIDE CR 10 MEQ CR-CAPS (POTASSIUM CHLORIDE) take two by mouth daily;  #60 x 5;  Signed;  Entered by: Lewanda Rife LPN;  Authorized by: Judith Part MD;  Method used: Electronically to [facility] [address] [phone]*, [address] [place], [place], [place]  [address], Ph: [phone] or [phone], Fax: [phone]    Prescriptions: POTASSIUM CHLORIDE CR 10 MEQ CR-CAPS (POTASSIUM CHLORIDE) take two by mouth daily  #60 x 5   Entered by:   Lewanda Rife LPN   Authorized by:   Judith Part MD   Signed by:   Lewanda Rife LPN on 64/40/3474   Method used:   Electronically to        [facility] [address] [phone]* (retail)       [address]       [place], [place]  [address]       Ph: [phone] or [phone]       Fax: [phone]   RxID:   [id number]   Prior Medications: ULTRAM 50 MG  TABS (TRAMADOL HCL) 1 by mouth two times a day as needed FLEXERIL 10 MG  TABS (CYCLOBENZAPRINE HCL) 1 by mouth three times a day prn ZOLOFT 100 MG  TABS (SERTRALINE HCL) 11/2 by mouth once daily OMEPRAZOLE 20 MG CPDR (OMEPRAZOLE) 1 by mouth  each am POTASSIUM CHLORIDE CR 10 MEQ CR-CAPS (POTASSIUM CHLORIDE) take two by mouth daily Current Allergies: ! VIOXX ! * BEXTRA ! VOLTAREN ! IBUPROFEN ! * ALEVE

## 2010-12-06 NOTE — Assessment & Plan Note (Signed)
Summary: FOLLOW UP   Vital Signs:  Patient profile:   50 year old female Weight:      186 pounds Temp:     98 degrees F oral Pulse rate:   92 / minute Pulse rhythm:   regular BP sitting:   130 / 85  (left arm) Cuff size:   regular  Vitals Entered By: Lowella Petties CMA (November 30, 2009 9:17 AM) CC: follow-up visit   History of Present Illness: here for f/u of chronic med problems   is feeling so/so   wt is down 7 lb from last visit is nauseated a lot - do not know why  a little abd pain now and then -- lower abd -- with constipated and diarrhea  diet is "crappy" -- - others try to make her eat healthy  otherwise she would eat junk food all the time and sweets  this makes her ibs and gerd act up   has been having some back problems- knot on back in mid back   tabacco -- is worse- smoking more  not ready to quit  her partner had side effects on chantix  is routinely coughing up green stuff and gets out of breath more easily with exercise  knows she should quit   fibromyalgia -- is worse with age  not too much exercise- is very active at home insurance no longer covers mobic   due for lipid check  Last Lipid ProfileCholesterol: HDL:  LDL:  Triglycerides:  Last Liver profileSGOT:  30 (08/26/2008 4:07:00 PM)SPGT:  38 (08/26/2008 4:07:00 PM)T. Bili:  0.7 (08/26/2008 4:07:00 PM)Alk Phos:  69 (08/26/2008 4:07:00 PM)   thinks menopause is starting -- with hot flashes  periods are irregular  more irritable    IMMS - is interested in flu and pneumovax       Allergies: 1)  ! Vioxx 2)  ! * Bextra 3)  ! Voltaren 4)  ! Ibuprofen 5)  ! * Aleve  Past History:  Past Medical History: Last updated: 08/26/2008 fibromyalgia  tabacco abuse depression gerd carpal tunnel syndrome hyperlipidemia hypokalemia   ortho [place] [facility]   Past Surgical History: Last updated: 08/26/2008 leg fx - at age 28 with screws placed 1995 L hip fx 06  hosp for pyellonephritis   Family History: Last updated: 08/26/2008 father arthritis, obesity, ? colitis PGF MI Puncle MI  aunt DM MGM DM  niece with DM cousin with leukemia   Social History: Last updated: 08/26/2008 has same sex partner  current smoker disabled from work due to fibromyalgia   Review of Systems General:  Complains of fatigue; denies fever, loss of appetite, and malaise. Eyes:  Denies blurring and eye pain. CV:  Complains of shortness of breath with exertion; denies chest pain or discomfort and lightheadness. Resp:  Denies cough and wheezing. GI:  Complains of constipation, diarrhea, and indigestion; denies bloody stools and vomiting. GU:  Denies discharge, dysuria, and urinary frequency. MS:  Complains of joint pain, muscle aches, and stiffness; denies muscle weakness. Derm:  Denies itching, lesion(s), poor wound healing, and rash. Neuro:  Denies numbness and tingling. Psych:  Complains of depression; denies suicidal thoughts/plans. Endo:  Denies cold intolerance, excessive thirst, excessive urination, and heat intolerance. Heme:  Denies abnormal bruising and bleeding.  Physical Exam  General:  overweight but generally well appearing   Head:  normocephalic, atraumatic, and no abnormalities observed.   Eyes:  vision grossly intact, pupils equal, pupils round, and pupils  reactive to light.  no conjunctival pallor, injection or icterus  Ears:  R ear normal and L ear normal.   Nose:  no nasal discharge.   Mouth:  pharynx pink and moist.   Neck:  supple with full rom and no masses or thyromegally, no JVD or carotid bruit  Chest Wall:  No deformities, masses, or tenderness noted. Lungs:  diffusely distant bs - without rales or rhonchi scant wheeze on forced exp only no prolonged exp phase Heart:  Normal rate and regular rhythm. S1 and S2 normal without gallop, murmur, click, rub or other extra sounds. Abdomen:  Bowel sounds positive,abdomen soft and  non-tender without masses, organomegaly or hernias noted. no renal bruits  Msk:  No deformity or scoliosis noted of thoracic or lumbar spine.  trigger points diffusely  limited rom SL nl rom ankles without swelling Pulses:  R and L carotid,radial,femoral,dorsalis pedis and posterior tibial pulses are full and equal bilaterally Extremities:  No clubbing, cyanosis, edema, or deformity noted with normal full range of motion of all joints.   Neurologic:  sensation intact to light touch, gait normal, and DTRs symmetrical and normal.  no tremor Skin:  Intact without suspicious lesions or rashes Cervical Nodes:  No lymphadenopathy noted Inguinal Nodes:  No significant adenopathy Psych:  seems down and negative today  good eye contact - is still animated   Impression & Recommendations:  Problem # 1:  HYPERLIPIDEMIA (ICD-272.4) Assessment Unchanged diet is a little better lately  rev low sat fat diet with more fiber  Orders: Venipuncture (52841) TLB-Lipid Panel (80061-LIPID)  Problem # 2:  IBS (ICD-564.1) Assessment: New with intermittent constipation and diarrhea  adv starting fiber suppl and higher fiber diet / exercise disc imp of water intake try citrucel and update  Problem # 3:  DEPRESSION (ICD-311) Assessment: Unchanged ongoing - more irritable with menopause  given option to inc zoloft in future if needed  also rec counseling if she can afford in future disc imp of exercise Her updated medication list for this problem includes:    Zoloft 100 Mg Tabs (Sertraline hcl) .Marland Kitchen... 11/2 by mouth once daily  Problem # 4:  FIBROMYALGIA (ICD-729.1) Assessment: Deteriorated pt had to stop nsaid due to cost -- will check with ins about approved opt or prior auth will ref to Dr Patsy Lager for ongoing focal back and ankle pain  The following medications were removed from the medication list:    Mobic 15 Mg Tabs (Meloxicam) .Marland Kitchen... 1 by mouth once daily with food as needed for pain Her updated  medication list for this problem includes:    Ultram 50 Mg Tabs (Tramadol hcl) .Marland Kitchen... 1 by mouth two times a day as needed    Flexeril 10 Mg Tabs (Cyclobenzaprine hcl) .Marland Kitchen... 1 by mouth three times a day prn  Problem # 5:  TOBACCO USE (ICD-305.1) Assessment: Unchanged discussed in detail risks of smoking, and possible outcomes including COPD, vascular dz, cancer and also respiratory infections/sinus problems   pt is not ready to quit yet- but voiced understanding flu shot and pneumovax today   Complete Medication List: 1)  Ultram 50 Mg Tabs (Tramadol hcl) .Marland Kitchen.. 1 by mouth two times a day as needed 2)  Flexeril 10 Mg Tabs (Cyclobenzaprine hcl) .Marland Kitchen.. 1 by mouth three times a day prn 3)  Zoloft 100 Mg Tabs (Sertraline hcl) .Marland Kitchen.. 11/2 by mouth once daily 4)  Omeprazole 20 Mg Cpdr (Omeprazole) .Marland Kitchen.. 1 by mouth  each am  Other Orders:  TLB-BMP (Basic Metabolic Panel-BMET) (80048-METABOL) TLB-CBC Platelet - w/Differential (85025-CBCD) TLB-Hepatic/Liver Function Pnl (80076-HEPATIC) TLB-TSH (Thyroid Stimulating Hormone) (84443-TSH) Flu Vaccine 57yrs + (16109) Admin 1st Vaccine (60454) Admin 1st Vaccine [facility]) [phone]) Pneumococcal Vaccine (14782) Admin of Any Addtl Vaccine (95621) Admin of Any Addtl Vaccine [employer]) [phone])  Patient Instructions: 1)  call your insurance company and ask about coverage for anti- inflammatories -- in the same class as mobic -- call me back with several alternatives that are covered (tell them you cannot have voltaren or ibuprofen or naproxen)  2)  we will schedule appt with Dr Patsy Lager at check out -- for back and ankle pain  3)  flu shot and pneumonia vaccine today  4)  labs today  5)  try to get more fiber -- I recommend citrucel one serving daily  6)  try to stay as active as you can  Prescriptions: OMEPRAZOLE 20 MG CPDR (OMEPRAZOLE) 1 by mouth  each am  #30 x 11   Entered and Authorized by:   Judith Part MD   Signed by:   Judith Part MD on 11/30/2009    Method used:   Print then Give to Patient   RxID:   [id number] ZOLOFT 100 MG  TABS (SERTRALINE HCL) 11/2 by mouth once daily  #45 x 11   Entered and Authorized by:   Judith Part MD   Signed by:   Judith Part MD on 11/30/2009   Method used:   Print then Give to Patient   RxID:   [id number]   Prior Medications (reviewed today): ULTRAM 50 MG  TABS (TRAMADOL HCL) 1 by mouth two times a day as needed FLEXERIL 10 MG  TABS (CYCLOBENZAPRINE HCL) 1 by mouth three times a day prn ZOLOFT 100 MG  TABS (SERTRALINE HCL) 11/2 by mouth once daily OMEPRAZOLE 20 MG CPDR (OMEPRAZOLE) 1 by mouth  each am Current Allergies: ! VIOXX ! * BEXTRA ! VOLTAREN ! IBUPROFEN ! * ALEVE   Pneumovax Vaccine    Vaccine Type: Pneumovax    Site: left deltoid    Mfr: Merck    Dose: 0.5 ml    Route: IM    Given by: Lowella Petties CMA    Exp. Date: 12/02/2010    Lot #: [id number]    VIS given: 06/03/96 version given November 30, 2009.  Influenza Vaccine    Vaccine Type: Fluvax 3+    Site: right deltoid    Mfr: GlaxoSmithKline    Dose: 0.5 ml    Given by: Lowella Petties CMA    Exp. Date: 05/05/2010    Lot #: [id number]    VIS given: 05/30/07 version given November 30, 2009.  Flu Vaccine Consent Questions    Do you have a history of severe allergic reactions to this vaccine? no    Any prior history of allergic reactions to egg and/or gelatin? no    Do you have a sensitivity to the preservative Thimersol? no    Do you have a past history of Guillan-Barre Syndrome? no    Do you currently have an acute febrile illness? no    Have you ever had a severe reaction to latex? no    Vaccine information given and explained to patient? yes    Are you currently pregnant? no

## 2010-12-06 NOTE — Progress Notes (Signed)
Summary: refill request for flexeril  Phone Note Refill Request Message from:  [employer]  Refills Requested: Medication #1:  FLEXERIL 10 MG  TABS 1 by mouth three times a day prn Electronic refill request from [facility].  No last filled date given.  Initial call taken by: Lowella Petties CMA,  November 08, 2009 9:25 AM  Follow-up for Phone Call        Denied.  I just filled it a few days ago. Follow-up by: Ruthe Mannan MD,  November 08, 2009 10:49 AM     Appended Document: refill request for flexeril Pharmacy advised.

## 2010-12-06 NOTE — Miscellaneous (Signed)
Summary: med list update- k dur  Medications Added POTASSIUM CHLORIDE CR 10 MEQ CR-CAPS (POTASSIUM CHLORIDE) take one by mouth daily       Clinical Lists Changes  Medications: Added new medication of POTASSIUM CHLORIDE CR 10 MEQ CR-CAPS (POTASSIUM CHLORIDE) take one by mouth daily     Prior Medications: ULTRAM 50 MG  TABS (TRAMADOL HCL) 1 by mouth two times a day as needed FLEXERIL 10 MG  TABS (CYCLOBENZAPRINE HCL) 1 by mouth three times a day prn ZOLOFT 100 MG  TABS (SERTRALINE HCL) 11/2 by mouth once daily OMEPRAZOLE 20 MG CPDR (OMEPRAZOLE) 1 by mouth  each am Current Allergies: ! VIOXX ! * BEXTRA ! VOLTAREN ! IBUPROFEN ! * ALEVE

## 2011-01-23 ENCOUNTER — Telehealth: Payer: Self-pay | Admitting: Family Medicine

## 2011-02-02 NOTE — Progress Notes (Signed)
Summary: Tramadol 50mg   Phone Note Refill Request Call back at [phone] Message from:  [facility] on January 23, 2011 4:45 PM  Refills Requested: Medication #1:  ULTRAM 50 MG  TABS 1 by mouth two times a day as needed   Last Refilled: 12/20/2010 [facility]ill on Tramadol.Please advise.    Method Requested: Telephone to Pharmacy Initial call taken by: Lewanda Rife LPN,  January 23, 2011 4:45 PM  Follow-up for Phone Call        px written on EMR for call in  Follow-up by: Judith Part MD,  January 23, 2011 5:04 PM  Additional Follow-up for Phone Call Additional follow up Details #1::        [facility] as instructed. Lewanda Rife LPN  January 23, 2011 5:10 PM     Prescriptions: ULTRAM 50 MG  TABS (TRAMADOL HCL) 1 by mouth two times a day as needed  #60 x 5   Entered and Authorized by:   Judith Part MD   Signed by:   Lewanda Rife LPN on 09/81/1914   Method used:   Telephoned to ...       [facility] [address] [phone]* (retail)       [address]       [place], [place]  [address]       Ph: [phone] or [phone]       Fax: [phone]   RxID:   [id number]

## 2011-03-24 NOTE — Assessment & Plan Note (Signed)
[facility]                             [place] OFFICE NOTE   Angela Petty, Angela Petty                       MRN:          [id number]  DATE:05/14/2006                            DOB:          1960-11-26    CHIEF COMPLAINT:  Disability re-certification and checking with  fibromyalgia.  Patient states that she needs to check-in in terms of her  state of disability.  Also need to recheck her potassium which is over-due  and is here for a visit today.  She states she is feeling about the same,  with a little worsening in general of her fatigue and her overall joint  stiffness.  Her right ankle is swelling more than it used to be if she is on  her feet for more than a few minutes at a time.  She is not doing much in  terms of activity.  She dog-sits for some friends but is unable to walk much  further than a block.  Her joint pain has increased specifically in her  hips, shoulders and elbows.  She can stand for between 2 and 30-minutes  without having to rest, she can walk less than a minute or two without  needing to rest and can sit for no more than an hour because of discomfort  in her back as well.  She also cannot maintain grip on anything for more  than a minute or two in terms of writing or typing.  She has been more  irritable lately because of her frustration involving this problem.  She is  also in a difficult social situation in terms of changing room-mates.  She  is currently continuing Flexeril 2-3 pills a day, Ultram 1-2 pills a day.  Zoloft 1 1/2 pills a day and Prilosec daily for her reflux which is under  good control.  She denies fever or chills, nasal symptoms, cough or any new  shortness of breath.  Of note she is still a smoker.  She denies nausea,  vomiting or any recurrence of her heartburn.  She denies rash or individual  joint swelling or redness, however she does note that her joints are very  stiff.  She denies any new weakness  or focal sensory loss and she states  that her mood other than irritability has been stable.   PHYSICAL EXAMINATION:  GENERAL:  This is a well appearing white female who  is somewhat frail in her walking ability.  VITAL SIGNS:  A weight of 180, temperature 97.4, pulse of 80 and a blood  pressure of 102/68.  HEENT:  Mucous membranes are moist.  Throat and nares are clear.  Tympanic  membranes are clear as well.  Pupils are equal and reactive to light  bilaterally with no conjunctivitis or conjunctiva pallor.  NECK:  Supple without adenopathy, JVD or goiter noted.  She does have point  tenderness on either side of her cervical spine.  LUNGS:  Clear to auscultation with very harsh breath sounds and a very  occasional end-expiratory wheeze, mostly on forced expiration with distant  breath sounds  throughout.  CARDIAC:  Regular rate and rhythm with no murmurs or gallops.  Carotid  arteries are without bruits.  EXTREMITIES:  Seem well perfused.  ABDOMEN:  Soft, non-tender, non-distended with no hepatosplenomegaly.   She has no acute joint changes except slight swelling of her anterior right  lateral malleolus which is slightly tender but no redness or warmth is  noted.  She has no change in her trigger points throughout her thoracolumbar  paraspinal area.  Skin is in good color and turgor with no new rash or moles  and her affect is somewhat blunted and depressed which is not unusual for  her.   ASSESSMENT/PLAN:  Fibromyalgia with need for re-certification of disability.  Her condition has not changed significantly from the past evaluation except  for slight increase in joint pain and fatigue.  Her disability status in my  opinion should not be changed at all.  We will continue to prescribe her  medications and recommend therapy as needed.  For adverse affect of  medications she does need her potassium checked today.  We will do that as  well as a comprehensive metabolic profile for adverse  effect to her other  medications.  Today Flexeril #90 with no refills and Ultram #60 with no  refills was prescribed.  For fatigue we will check a complete blood count  and thyroid stimulating hormone as well, although fatigue is most likely  secondary to her fibromyalgia and secondary to depression.  She will also  continue her Zoloft for depression and situational stress and once again for  smoking she was urged to quit and I offered her a prescription for Chantix  that would help.  Again I do not think her disability status should be  changed at all based on my exam and interview today.                                   Marne A. Tower, MD   MAT/MedQ  DD:  06/14/2006  DT:  06/14/2006  Job #:  [id number]

## 2011-03-24 NOTE — Discharge Summary (Signed)
Angela Petty, MOUDY NO.:  [id number]   MEDICAL RECORD NO.:  [id number]          PATIENT TYPE:  INP   LOCATION:  5731                         FACILITY:  [facility]   PHYSICIAN:  Rene Paci, M.D. LHCDATE OF BIRTH:  [birth year]   DATE OF ADMISSION:  07/19/2005  DATE OF DISCHARGE:  07/21/2005                                 DISCHARGE SUMMARY   DISCHARGE DIAGNOSES:  1.  Pyelonephritis/urinary tract infection, Escherichia coli.  2.  Hypokalemia, rule out hyperaldosteronism.  3.  Metabolic syndrome with tendency toward glucose intolerance, exacerbated      by acute infection.   HISTORY OF PRESENT ILLNESS:  The patient is a 50 year old female with a  three-day history of fatigue, dysuria, fever and low back pain.  The patient  presented to the ER with a temperature of 100.3 and was admitted for IV  antibiotics.   PAST MEDICAL HISTORY:  1.  GERD.  2.  Depression.  3.  Anxiety.  4.  Fibromyalgia.  5.  Arthritis.   COURSE OF HOSPITALIZATION:  Problem 1.  PYELONEPHRITIS/URINARY TRACT INFECTION:  The patient was  admitted and placed on IV Cipro.  Urine culture grew greater than 100,000 of  E. coli.  At the time of this dictation, sensitivity is pending.  White  count at time of admission was 29.9 and on second day of admission, white  blood cell count had dropped to 12.3.  The patient had nausea and vomiting  at the time of admission, which has since resolved.   Problem 2.  HYPOKALEMIA:  The patient was admitted and was noted to have a  potassium at time of admission of 2.7.  The patient was given IV and p.o.  potassium during this admission and has shown little improvement in  potassium level.  At the time of discharge, the patient's potassium is 2.9  despite repletion.  The following laboratories are still pending and will  need outpatient follow-up:  Renin, aldosterone, urine and serum osmolality.  We suspect the patient may have renal loss of potassium and we  are ruling  out hyperaldosteronism.  The patient will be discharged on 40 mEq of K-Dur  and will need follow-up of potassium as an outpatient.   Problem 3.  METABOLIC SYNDROME WITH GLUCOSE INTOLERANCE:  At time of  admission, the patient was noted to have glucosuria as well as serum glucose  of 161.  A hemoglobin A1c was performed, which was 5.4.  The patient will  need outpatient follow-up for glucose intolerance and continued monitoring  for possible development of diabetes type 2 in the future.   MEDICATIONS AT DISCHARGE:  1.  K-Dur 40 mEq p.o. daily, prescription provided for 30 tablets.  2.  Zoloft 150 mg p.o. daily.  3.  Flexeril 10 mg three times daily as needed.  4.  Cipro 500 mg p.o. b.i.d. for 10 additional days, prescription provided      for a 10-day supply.  5.  Prilosec, the patient is to continue preadmission dosing.   LABORATORIES AT DISCHARGE:  Sodium 135, potassium 2.9, chloride 101, CO2 26,  BUN 6, creatinine 0.9, glucose 113.   FOLLOW-UP:  A follow-up appointment has been scheduled for the patient to  see [facility] A. Tower, M.D., on Monday, September 18, at 11 a.m.  At the time  of this appointment, outstanding labs for hypokalemia workup will need to be  reviewed as detailed above.  In addition, the patient will need a repeat  serum potassium level drawn.  A follow-up appointment has been scheduled for  the patient to see the patient on Monday, September 18, at 11 a.m.  In  addition, the patient has been instructed to call Dr. Milinda Antis or go to the  emergency room if she develops a fever greater than 101, nausea, vomiting or  low back pain.      Melissa S. Peggyann Juba, NP      Rene Paci, M.D. [facility]  Electronically Signed    MSO/MEDQ  D:  07/21/2005  T:  07/21/2005  Job:  [id number]

## 2011-04-05 ENCOUNTER — Other Ambulatory Visit: Payer: Self-pay | Admitting: Family Medicine

## 2011-04-05 NOTE — Telephone Encounter (Signed)
Medication ph[facility] as instructed.

## 2011-04-05 NOTE — Telephone Encounter (Signed)
Px written for call in   

## 2011-07-13 ENCOUNTER — Other Ambulatory Visit: Payer: Self-pay | Admitting: Family Medicine

## 2011-07-13 NOTE — Telephone Encounter (Signed)
[facility]ill Tramadol 50 mg. Last filled 01/23/11 # 60 with 5 refills. Pt last seen 10/07/10 by Dr Milinda Antis with no future appts scheduled. Added med to [employer]. Since Dr Milinda Antis is out of office and off computer until Tues.07/18/11 please advise.

## 2011-07-13 NOTE — Telephone Encounter (Signed)
rx sent, have pt scheduled with CPE with Tower (it may need to be 366 days after last CPE).  Thanks.

## 2011-09-12 ENCOUNTER — Other Ambulatory Visit: Payer: Self-pay | Admitting: Family Medicine

## 2011-09-12 NOTE — Telephone Encounter (Signed)
[facility] request refill Tramadol 50 mg. Last refill 08/12/11.

## 2011-09-12 NOTE — Telephone Encounter (Signed)
She is due for f/u in dec- please schedule  Will refill electronically

## 2011-09-14 NOTE — Telephone Encounter (Signed)
Patient notified as instructed by telephone. Pt scheduled appt with Dr Milinda Antis 10/18/11 at 3:15pm.

## 2011-09-26 ENCOUNTER — Other Ambulatory Visit: Payer: Self-pay

## 2011-09-26 MED ORDER — CYCLOBENZAPRINE HCL 10 MG PO TABS: ORAL_TABLET | ORAL | Status: DC

## 2011-09-26 NOTE — Telephone Encounter (Signed)
[facility] refill for Cyclobenzaprine 10 mg. Last filled 08/24/11 and pt last seen 10/07/10.

## 2011-09-26 NOTE — Telephone Encounter (Signed)
Will refill electronically  

## 2011-10-18 ENCOUNTER — Ambulatory Visit (INDEPENDENT_AMBULATORY_CARE_PROVIDER_SITE_OTHER): Payer: Self-pay | Admitting: Family Medicine

## 2011-10-18 ENCOUNTER — Encounter: Payer: Self-pay | Admitting: Family Medicine

## 2011-10-18 VITALS — BP 100/70 | HR 92 | Temp 98.4°F | Ht 68.75 in | Wt 195.2 lb

## 2011-10-18 DIAGNOSIS — R5381 Other malaise: Secondary | ICD-10-CM

## 2011-10-18 DIAGNOSIS — F17200 Nicotine dependence, unspecified, uncomplicated: Secondary | ICD-10-CM

## 2011-10-18 DIAGNOSIS — E785 Hyperlipidemia, unspecified: Secondary | ICD-10-CM

## 2011-10-18 DIAGNOSIS — E876 Hypokalemia: Secondary | ICD-10-CM

## 2011-10-18 DIAGNOSIS — D72829 Elevated white blood cell count, unspecified: Secondary | ICD-10-CM

## 2011-10-18 DIAGNOSIS — IMO0001 Reserved for inherently not codable concepts without codable children: Secondary | ICD-10-CM

## 2011-10-18 DIAGNOSIS — Z23 Encounter for immunization: Secondary | ICD-10-CM

## 2011-10-18 NOTE — Assessment & Plan Note (Signed)
Lab today No missed doses

## 2011-10-18 NOTE — Assessment & Plan Note (Signed)
Disc in detail risks of smoking and possible outcomes including copd, vascular/ heart disease, cancer , respiratory and sinus infections  Pt voices understanding  She is getting closer to quit but not ready yet  Gave her info on the cancer center free class  Also disc opt for tx to help her quit

## 2011-10-18 NOTE — Assessment & Plan Note (Signed)
Labs today On zocor and diet  Disc goals for lipids and reasons to control them Rev labs with pt from last draw  Rev low sat fat diet in detail - she agrees that she really needs do to better with this

## 2011-10-18 NOTE — Patient Instructions (Signed)
Keep thinking about quitting smoking  Please work on low cholesterol diet (Avoid red meat/ fried foods/ egg yolks/ fatty breakfast meats/ butter, cheese and high fat dairy/ and shellfish  )  Labs today  Low impact exercise really helps fibromyalgia - swimming /yoga/ low impact exercise videos No change in medicine

## 2011-10-18 NOTE — Progress Notes (Signed)
Subjective:    Patient ID: Angela Petty, female    DOB: 06/30/1961, 50 y.o.   MRN: [id number]  HPI Here for f/u of chronic conditions including fibromyalgia and hyperlipidemia and smoking  Is doing ok overall  Nothing new medically - just more pain   No new meds or herbs  Due for labs today - for K and cholesterol     bp good at 100/70 Wt down 2 lb with bmi of 29 Not eating healthy - does not eat as much fried food  Needs to eat more fruit or vegetables  Exercise -is keeping up her house -and climbs stairs a lot   No acess to a pool Had a friend with a pool this summer - got to swim a little  That is the exercise she tolerates with fibromyalgia  Lipids Diet- eating less fried foods/ but loves red meat , but eats chicken more  Lab Results  Component Value Date   CHOL 202* 10/07/2010   HDL 41.20 10/07/2010   LDLCALC 124* 05/20/2010   LDLDIRECT 137.9 10/07/2010   TRIG 166.0* 10/07/2010   CHOLHDL 5 10/07/2010    Tab status -is about 1/2 ppd at this time  No serious interest in quitting -not really ready  Some chronic cough  Stress level is low in general    Fibromyalgia - is worse - and continues her medicines  Takes flexeril- tolerates that along with ultram if needed   Mood has been good overall- on zoloft No side effects No new stressors No SI or change Has been unable to come off this in the past - so continues same dose   Got a flu shot today    Patient Active Problem List  Diagnoses  . HYPERLIPIDEMIA  . HYPOKALEMIA  . TOBACCO USE  . DEPRESSION  . IBS  . FIBROMYALGIA  . FATIGUE  . LEUKOCYTOSIS   Past Medical History  Diagnosis Date  . Depression   . GERD (gastroesophageal reflux disease)   . Hyperlipidemia   . Fibromyalgia   . Tobacco abuse   . Carpal tunnel syndrome   . Hypokalemia   . Pyelonephritis 2006    hsopoitalized   Past Surgical History  Procedure Date  . Leg surgery age 30    leg surgery with screws placed  . Hip fracture surgery  1995    left   History  Substance Use Topics  . Smoking status: Current Everyday Smoker  . Smokeless tobacco: Not on file  . Alcohol Use:    Family History  Problem Relation Age of Onset  . Obesity Father   . Arthritis Father   . Heart disease Paternal Uncle     MI  . Diabetes Maternal Grandmother   . Heart disease Paternal Grandfather     MI  . Cancer Cousin     leukemia   Allergies  Allergen Reactions  . Diclofenac Sodium     REACTION: GI  . Ibuprofen     REACTION: GI  . Naproxen Sodium     REACTION: GI, and did not work  . Rofecoxib     REACTION: GI   Current Outpatient Prescriptions on File Prior to Visit  Medication Sig Dispense Refill  . cyclobenzaprine (FLEXERIL) 10 MG tablet Take 1 tablet by mouth three times daily as needed  90 tablet  1  . traMADol (ULTRAM) 50 MG tablet TAKE 1 TABLET BY MOUTH TWICE A DAY AS NEEDED  60 tablet  1  Review of Systems Review of Systems  Constitutional: Negative for fever, appetite change, and unexpected weight change.pos for chronic fatigue   Eyes: Negative for pain and visual disturbance.  Respiratory: Negative for cough and shortness of breath.   Cardiovascular: Negative for cp or palpitations    Gastrointestinal: Negative for nausea, diarrhea and constipation.  Genitourinary: Negative for urgency and frequency.  Skin: Negative for pallor or rash   MSK pos for constant muscle and joint pain / occ "puffiness" of joints Neurological: Negative for weakness, light-headedness, numbness and headaches.  Hematological: Negative for adenopathy. Does not bruise/bleed easily.  Psychiatric/Behavioral: pos for depression that zoloft controls         Objective:   Physical Exam  Constitutional: She appears well-developed and well-nourished. No distress.       overwt and well appearing   HENT:  Head: Normocephalic and atraumatic.  Right Ear: External ear normal.  Left Ear: External ear normal.  Mouth/Throat: Oropharynx is  clear and moist.  Eyes: Conjunctivae and EOM are normal. Pupils are equal, round, and reactive to light. No scleral icterus.  Neck: Normal range of motion. Neck supple. No JVD present. Carotid bruit is not present. No thyromegaly present.  Cardiovascular: Normal rate, regular rhythm, normal heart sounds and intact distal pulses.  Exam reveals no gallop.   Pulmonary/Chest: Effort normal and breath sounds normal. No respiratory distress. She has no wheezes. She exhibits tenderness.  Abdominal: Soft. Bowel sounds are normal. She exhibits no distension, no abdominal bruit and no mass. There is no tenderness.  Musculoskeletal: Normal range of motion. She exhibits no edema and no tenderness.       Positive myofascial trigger points along back   Lymphadenopathy:    She has no cervical adenopathy.  Neurological: She is alert. She has normal reflexes. She displays no tremor. No cranial nerve deficit. She exhibits normal muscle tone. Coordination normal.  Skin: Skin is warm and dry. No rash noted. No erythema. No pallor.  Psychiatric: Her speech is normal. Thought content normal. Her affect is blunt. Cognition and memory are normal.       Good eye contact and communication skills            Assessment & Plan:

## 2011-10-18 NOTE — Assessment & Plan Note (Signed)
Labs today- has been stable

## 2011-10-18 NOTE — Assessment & Plan Note (Signed)
Overall about the same - along with fibromyalgia and chronic depression and menopause mt labs today  Update if changes

## 2011-10-18 NOTE — Assessment & Plan Note (Signed)
Some worsening with time- but tolerating  Will continue flexeril that works the best for her along with ssri Disc options for low impact exercise that may be helpful

## 2011-10-19 LAB — COMPREHENSIVE METABOLIC PANEL
ALT: 39 U/L — ABNORMAL HIGH (ref 0–35)
CO2: 28 mEq/L (ref 19–32)
Calcium: 9.6 mg/dL (ref 8.4–10.5)
Chloride: 104 mEq/L (ref 96–112)
Creatinine, Ser: 0.8 mg/dL (ref 0.4–1.2)
GFR: 84.82 mL/min (ref >60.00)
Glucose, Bld: 91 mg/dL (ref 70–99)

## 2011-10-19 LAB — CBC WITH DIFFERENTIAL/PLATELET
Basophils Absolute: 0 10*3/uL (ref 0.0–0.1)
Eosinophils Relative: 3.3 % (ref 0.0–5.0)
Hemoglobin: 14.1 g/dL (ref 12.0–15.0)
Lymphocytes Relative: 26.1 % (ref 12.0–46.0)
Monocytes Relative: 5.5 % (ref 3.0–12.0)
Platelets: 292 10*3/uL (ref 150.0–400.0)
RDW: 13.2 % (ref 11.5–14.6)
WBC: 8.5 10*3/uL (ref 4.5–10.5)

## 2011-10-19 LAB — LIPID PANEL
Cholesterol: 207 mg/dL — ABNORMAL HIGH (ref 0–200)
HDL: 42.7 mg/dL (ref >39.00)
VLDL: 46 mg/dL — ABNORMAL HIGH (ref 0.0–40.0)

## 2011-10-20 ENCOUNTER — Other Ambulatory Visit: Payer: Self-pay | Admitting: Family Medicine

## 2011-10-20 LAB — LDL CHOLESTEROL, DIRECT: Direct LDL: 134.9 mg/dL

## 2011-10-20 NOTE — Telephone Encounter (Signed)
Ok to refill 

## 2011-10-20 NOTE — Telephone Encounter (Signed)
Will refill electronically  

## 2011-10-27 ENCOUNTER — Other Ambulatory Visit: Payer: Self-pay | Admitting: *Deleted

## 2011-10-27 MED ORDER — OMEPRAZOLE 20 MG PO TBEC: 1.0000 | DELAYED_RELEASE_TABLET | Freq: Every day | ORAL | Status: DC

## 2011-11-10 ENCOUNTER — Other Ambulatory Visit: Payer: Self-pay | Admitting: Family Medicine

## 2011-11-10 NOTE — Telephone Encounter (Signed)
Will refill electronically  

## 2011-11-10 NOTE — Telephone Encounter (Signed)
[facility] request refill Tramadol 50 mg. Pt last seen 10/18/11.Please advise.

## 2011-11-14 IMAGING — CR DG TOE 5TH 2+V*L*
4 series · 4 of 4 positions shown · non-contrast
Comparison: None.

CLINICAL DATA: Blunt trauma to toe

LEFT TOE - 2+ VIEW

[t toes ap left]
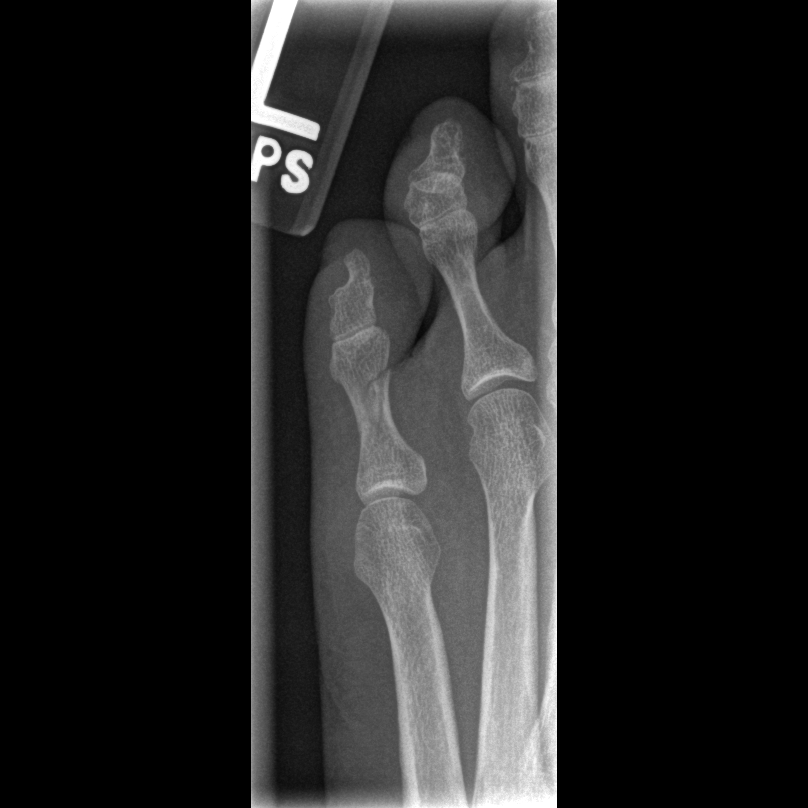

[t toes oblique left]
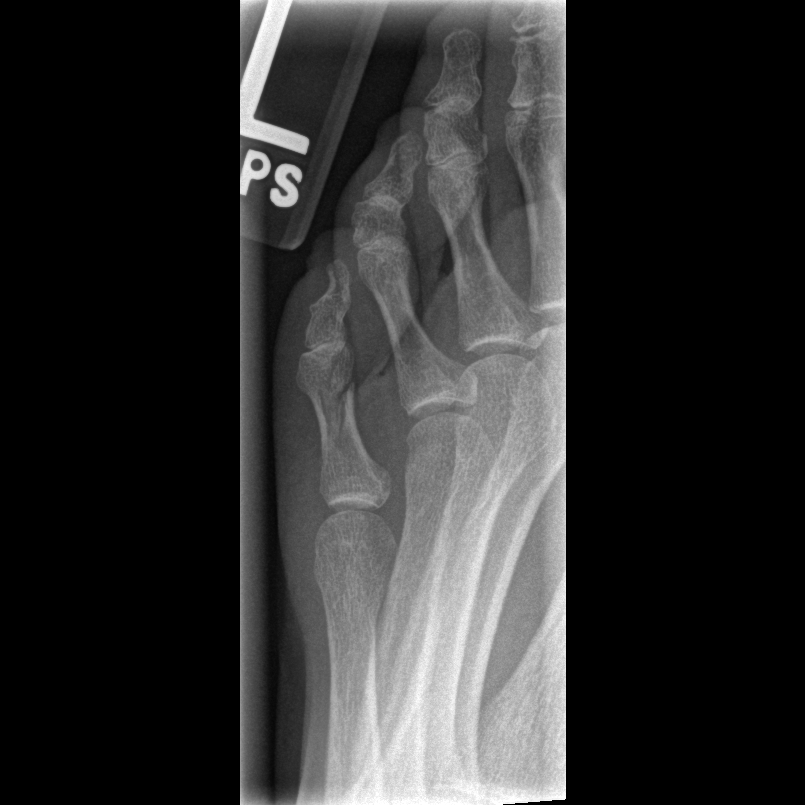

[t toes lateral left * (1 of 2)]
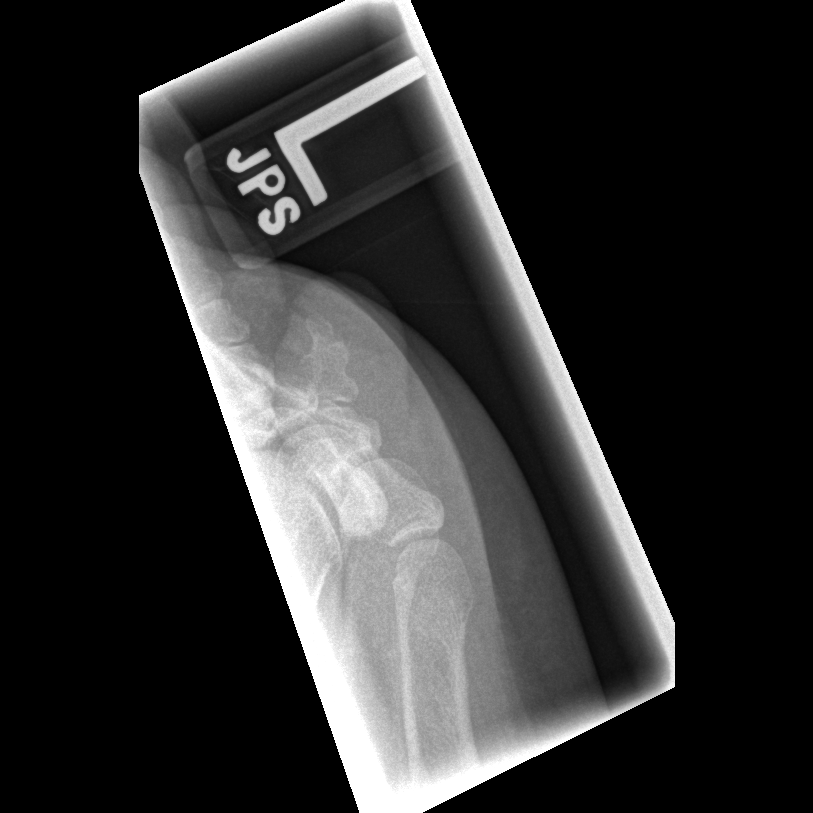

[t toes lateral left * (2 of 2)]
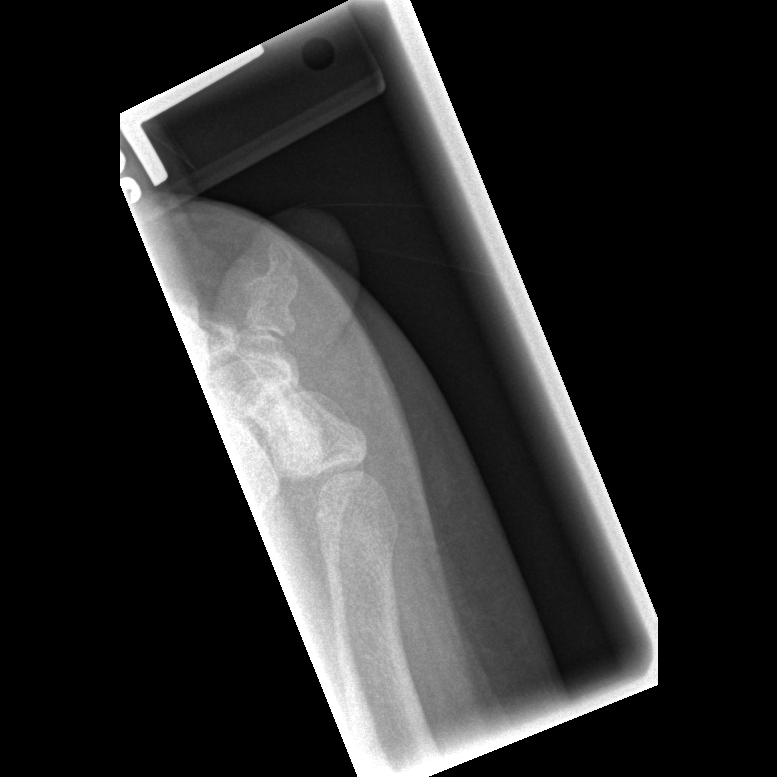

[4 of 4 positions shown; findings below may reference images not displayed]

FINDINGS: There is a nondisplaced fracture of the proximal phalanx.
IMPRESSION: Fracture of the proximal phalanx.

## 2011-11-17 ENCOUNTER — Other Ambulatory Visit: Payer: Self-pay | Admitting: *Deleted

## 2011-11-17 MED ORDER — SIMVASTATIN 20 MG PO TABS: 20.0000 mg | ORAL_TABLET | Freq: Every day | ORAL | Status: DC

## 2011-11-28 ENCOUNTER — Other Ambulatory Visit: Payer: Self-pay | Admitting: *Deleted

## 2011-11-28 MED ORDER — CYCLOBENZAPRINE HCL 10 MG PO TABS: ORAL_TABLET | ORAL | Status: DC

## 2011-11-28 NOTE — Telephone Encounter (Signed)
Will refill electronically  

## 2012-02-16 ENCOUNTER — Encounter: Payer: Self-pay | Admitting: Gastroenterology

## 2012-05-08 ENCOUNTER — Other Ambulatory Visit: Payer: Self-pay | Admitting: Family Medicine

## 2012-05-08 NOTE — Telephone Encounter (Signed)
[facility] request Tramadol. Last filled 04/09/12.Please advise.

## 2012-05-08 NOTE — Telephone Encounter (Signed)
Will refill electronically  

## 2012-09-24 ENCOUNTER — Other Ambulatory Visit: Payer: Self-pay | Admitting: Family Medicine

## 2012-09-24 NOTE — Telephone Encounter (Signed)
Please make her a follow up appt in January and refil until then, thanks

## 2012-09-24 NOTE — Telephone Encounter (Signed)
Ok to refill 

## 2012-09-24 NOTE — Telephone Encounter (Signed)
Tried to call pt to schedule appt and all #'s listed were off so mailed pt a letter letting pt know to call office to schedule appt. Before we can refill med

## 2012-09-26 ENCOUNTER — Telehealth: Payer: Self-pay

## 2012-09-26 NOTE — Telephone Encounter (Signed)
[facility]ified [facility] that I am waiting to talk with pt and schedule a f/u appt before I can refill med. All of pt's #'s were off so I had to mail her a letter an I am still waiting to here from pt

## 2012-09-26 NOTE — Telephone Encounter (Signed)
Pt left v/m has scheduled appt 10/08/12; request refill flex[facility]il to [facility].Please advise.

## 2012-09-26 NOTE — Telephone Encounter (Signed)
[facility] left v/m requesting status of Flexeril and call back [facility] at [phone].Please advise.

## 2012-09-26 NOTE — Telephone Encounter (Signed)
Meds refilled.

## 2012-09-26 NOTE — Telephone Encounter (Signed)
Pt scheduled appt and meds refilled

## 2012-10-08 ENCOUNTER — Ambulatory Visit (INDEPENDENT_AMBULATORY_CARE_PROVIDER_SITE_OTHER): Payer: Federal, State, Local not specified - PPO | Admitting: Family Medicine

## 2012-10-08 ENCOUNTER — Encounter: Payer: Self-pay | Admitting: Family Medicine

## 2012-10-08 VITALS — BP 112/80 | HR 102 | Temp 98.9°F | Ht 68.75 in | Wt 188.0 lb

## 2012-10-08 DIAGNOSIS — E876 Hypokalemia: Secondary | ICD-10-CM

## 2012-10-08 DIAGNOSIS — D72829 Elevated white blood cell count, unspecified: Secondary | ICD-10-CM

## 2012-10-08 DIAGNOSIS — Z23 Encounter for immunization: Secondary | ICD-10-CM

## 2012-10-08 DIAGNOSIS — E785 Hyperlipidemia, unspecified: Secondary | ICD-10-CM

## 2012-10-08 DIAGNOSIS — F17200 Nicotine dependence, unspecified, uncomplicated: Secondary | ICD-10-CM

## 2012-10-08 DIAGNOSIS — IMO0001 Reserved for inherently not codable concepts without codable children: Secondary | ICD-10-CM

## 2012-10-08 LAB — CBC WITH DIFFERENTIAL/PLATELET
Basophils Absolute: 0 10*3/uL (ref 0.0–0.1)
Hemoglobin: 13.6 g/dL (ref 12.0–15.0)
Lymphocytes Relative: 36.7 % (ref 12.0–46.0)
Monocytes Relative: 7.8 % (ref 3.0–12.0)
Neutrophils Relative %: 52.3 % (ref 43.0–77.0)
Platelets: 288 10*3/uL (ref 150.0–400.0)
RDW: 13.4 % (ref 11.5–14.6)

## 2012-10-08 LAB — COMPREHENSIVE METABOLIC PANEL
ALT: 21 U/L (ref 0–35)
CO2: 29 mEq/L (ref 19–32)
Calcium: 9 mg/dL (ref 8.4–10.5)
Chloride: 102 mEq/L (ref 96–112)
GFR: 99.19 mL/min (ref >60.00)
Potassium: 3.9 mEq/L (ref 3.5–5.1)
Sodium: 139 mEq/L (ref 135–145)
Total Protein: 7.1 g/dL (ref 6.0–8.3)

## 2012-10-08 LAB — LIPID PANEL
Cholesterol: 183 mg/dL (ref 0–200)
HDL: 43.4 mg/dL (ref >39.00)
LDL Cholesterol: 105 mg/dL — ABNORMAL HIGH (ref 0–99)
Total CHOL/HDL Ratio: 4
Triglycerides: 175 mg/dL — ABNORMAL HIGH (ref 0.0–149.0)

## 2012-10-08 MED ORDER — SIMVASTATIN 20 MG PO TABS: 20.0000 mg | ORAL_TABLET | Freq: Every day | ORAL | Status: DC

## 2012-10-08 MED ORDER — SERTRALINE HCL 100 MG PO TABS: ORAL_TABLET | ORAL | Status: DC

## 2012-10-08 MED ORDER — POTASSIUM CHLORIDE ER 10 MEQ PO CPCR: 20.0000 meq | ORAL_CAPSULE | Freq: Every day | ORAL | Status: DC

## 2012-10-08 MED ORDER — OMEPRAZOLE 20 MG PO TBEC: 1.0000 | DELAYED_RELEASE_TABLET | Freq: Every day | ORAL | Status: DC

## 2012-10-08 NOTE — Assessment & Plan Note (Signed)
Lab today on current supplementation  No cramping lately

## 2012-10-08 NOTE — Assessment & Plan Note (Signed)
Disc in detail risks of smoking and possible outcomes including copd, vascular/ heart disease, cancer , respiratory and sinus infections  Pt voices understanding  Pt is not ready to quit  

## 2012-10-08 NOTE — Assessment & Plan Note (Signed)
The same - disc imp of low impact exercise - made some suggestions Will use flexeril/ tramadol as px

## 2012-10-08 NOTE — Assessment & Plan Note (Signed)
Lab today Has been stable No infections

## 2012-10-08 NOTE — Patient Instructions (Addendum)
Keep thinking about quitting smoking  I'm glad you cut back on soda  Try to do some low impact exercise Avoid red meat/ fried foods/ egg yolks/ fatty breakfast meats/ butter, cheese and high fat dairy/ and shellfish   Labs today  Flu shot and tetanus shots today as well

## 2012-10-08 NOTE — Assessment & Plan Note (Signed)
Lipid check today  On statin and diet Rev low sat fat diet

## 2012-10-08 NOTE — Progress Notes (Signed)
Subjective:    Patient ID: Angela Petty, female    DOB: Feb 25, 1961, 51 y.o.   MRN: [id number]  HPI Here for f/u of chronic medical problems   Is doing well overall    Due for lipid check on statin  No problems with that   Wt is down 7 lb with bmi of 27-- is doing well -- has cut back on her cokes and drinking much more water- for the past mo and feels better   Fibromyalgia- on ultram and flexeril Usual aches and pains- about the same  Needs to exercise more    Hx of low K-no more muscle cramps than usual    Flu- just got today  Tdap - due for that also   Smoking about 1/2ppd Not ready to quit at this time   Patient Active Problem List  Diagnosis  . HYPERLIPIDEMIA  . HYPOKALEMIA  . TOBACCO USE  . DEPRESSION  . IBS  . FIBROMYALGIA  . FATIGUE  . LEUKOCYTOSIS   Past Medical History  Diagnosis Date  . Depression   . GERD (gastroesophageal reflux disease)   . Hyperlipidemia   . Fibromyalgia   . Tobacco abuse   . Carpal tunnel syndrome   . Hypokalemia   . Pyelonephritis 2006    hsopoitalized   Past Surgical History  Procedure Date  . Leg surgery age 37    leg surgery with screws placed  . Hip fracture surgery 1995    left   History  Substance Use Topics  . Smoking status: Current Every Day Smoker -- 0.5 packs/day  . Smokeless tobacco: Not on file  . Alcohol Use: No   Family History  Problem Relation Age of Onset  . Obesity Father   . Arthritis Father   . Heart disease Paternal Uncle     MI  . Diabetes Maternal Grandmother   . Heart disease Paternal Grandfather     MI  . Cancer Cousin     leukemia   Allergies  Allergen Reactions  . Diclofenac Sodium     REACTION: GI  . Ibuprofen     REACTION: GI  . Naproxen Sodium     REACTION: GI, and did not work  . Rofecoxib     REACTION: GI   Current Outpatient Prescriptions on File Prior to Visit  Medication Sig Dispense Refill  . cyclobenzaprine (FLEXERIL) 10 MG tablet TAKE 1 TABLET BY MOUTH  THREE TIMES DAILY AS NEEDED  90 tablet  0  . Multiple Vitamin (MULTIVITAMIN) tablet Take 1 tablet by mouth daily.        . Omeprazole 20 MG TBEC Take 1 tablet (20 mg total) by mouth daily.  30 each  11  . potassium chloride (MICRO-K) 10 MEQ CR capsule TAKE 2 CAPSULES BY MOUTH EVERY DAY  60 capsule  11  . sertraline (ZOLOFT) 100 MG tablet TAKE 1 & 1/2 TABLET BY MOUTH ONCE DAILY  45 tablet  11  . simvastatin (ZOCOR) 20 MG tablet Take 1 tablet (20 mg total) by mouth at bedtime.  30 tablet  12  . traMADol (ULTRAM) 50 MG tablet TAKE 1 TABLET BY MOUTH TWICE A DAY AS NEEDED  60 tablet  5  . [DISCONTINUED] potassium chloride (KLOR-CON) 10 MEQ CR tablet Take 20 mEq by mouth daily.                Review of Systems Review of Systems  Constitutional: Negative for fever, appetite change, and unexpected weight change.  pos for chronic fatigue  Eyes: Negative for pain and visual disturbance.  Respiratory: Negative for cough and shortness of breath.   Cardiovascular: Negative for cp or palpitations    Gastrointestinal: Negative for nausea, diarrhea and constipation.  Genitourinary: Negative for urgency and frequency.  Skin: Negative for pallor or rash   MSK pos for ongoing myofascial pain, neg for acute joint changes Neurological: Negative for weakness, light-headedness, numbness and headaches.  Hematological: Negative for adenopathy. Does not bruise/bleed easily.  Psychiatric/Behavioral: Negative for dysphoric mood. The patient is not nervous/anxious.         Objective:   Physical Exam  Constitutional: She appears well-developed and well-nourished. No distress.  HENT:  Head: Normocephalic and atraumatic.  Mouth/Throat: Oropharynx is clear and moist.  Eyes: Conjunctivae normal and EOM are normal. Pupils are equal, round, and reactive to light. Right eye exhibits no discharge. Left eye exhibits no discharge. No scleral icterus.  Neck: Normal range of motion. Neck supple. No JVD present. Carotid  bruit is not present. No thyromegaly present.  Cardiovascular: Normal rate, regular rhythm, normal heart sounds and intact distal pulses.  Exam reveals no gallop.   Pulmonary/Chest: Effort normal and breath sounds normal. No respiratory distress. She has no wheezes. She has no rales.       Diffusely distant bs   Abdominal: Soft. Bowel sounds are normal. She exhibits no distension, no abdominal bruit and no mass. There is no tenderness.  Musculoskeletal: She exhibits no edema.  Lymphadenopathy:    She has no cervical adenopathy.  Neurological: She is alert. She has normal reflexes. No cranial nerve deficit. She exhibits normal muscle tone. Coordination normal.  Skin: Skin is warm and dry. No rash noted. No erythema. No pallor.  Psychiatric: She has a normal mood and affect.          Assessment & Plan:

## 2012-10-09 ENCOUNTER — Encounter: Payer: Self-pay | Admitting: *Deleted

## 2012-10-23 ENCOUNTER — Other Ambulatory Visit: Payer: Self-pay | Admitting: Family Medicine

## 2012-10-23 NOTE — Telephone Encounter (Signed)
Ok to refill 

## 2012-10-23 NOTE — Telephone Encounter (Signed)
She can have 6 months of refils-thanks

## 2012-11-06 ENCOUNTER — Other Ambulatory Visit: Payer: Self-pay | Admitting: Family Medicine

## 2012-11-07 NOTE — Telephone Encounter (Signed)
Please give her 3 mo of refils, thanks  

## 2012-11-07 NOTE — Telephone Encounter (Signed)
Ok to refill 

## 2012-12-07 ENCOUNTER — Other Ambulatory Visit: Payer: Self-pay | Admitting: Family Medicine

## 2013-02-04 ENCOUNTER — Other Ambulatory Visit: Payer: Self-pay | Admitting: Family Medicine

## 2013-02-04 NOTE — Telephone Encounter (Signed)
Please refil for 6 months-thanks

## 2013-02-04 NOTE — Telephone Encounter (Signed)
done

## 2013-02-04 NOTE — Telephone Encounter (Signed)
Ok to refill 

## 2013-04-07 ENCOUNTER — Other Ambulatory Visit: Payer: Self-pay | Admitting: Family Medicine

## 2013-04-07 NOTE — Telephone Encounter (Signed)
Please refil for 6 mo

## 2013-04-07 NOTE — Telephone Encounter (Signed)
Electronic refill request, please advise  

## 2013-04-07 NOTE — Telephone Encounter (Signed)
done

## 2013-07-04 ENCOUNTER — Other Ambulatory Visit: Payer: Self-pay | Admitting: Family Medicine

## 2013-07-04 NOTE — Telephone Encounter (Signed)
Px written for call in   

## 2013-07-04 NOTE — Telephone Encounter (Signed)
Rx called in as prescribed 

## 2013-07-04 NOTE — Telephone Encounter (Signed)
Electronic refill request, please advise  

## 2013-07-05 ENCOUNTER — Other Ambulatory Visit: Payer: Self-pay | Admitting: Family Medicine

## 2013-07-08 NOTE — Telephone Encounter (Signed)
Electronic refill request, please advise  

## 2013-07-08 NOTE — Telephone Encounter (Signed)
Just done on 8/29?

## 2013-07-08 NOTE — Telephone Encounter (Signed)
Spoke with pharmacy and they did get the Rx that was called in on 07/04/13, this refill request was an error, I declined refill

## 2013-09-28 ENCOUNTER — Other Ambulatory Visit: Payer: Self-pay | Admitting: Family Medicine

## 2013-09-29 NOTE — Telephone Encounter (Signed)
Please refill all for 6 mo-thanks

## 2013-09-29 NOTE — Telephone Encounter (Signed)
Electronic refill request, please advise  

## 2013-09-30 NOTE — Telephone Encounter (Signed)
done

## 2013-10-01 ENCOUNTER — Other Ambulatory Visit: Payer: Self-pay | Admitting: Family Medicine

## 2013-10-01 NOTE — Telephone Encounter (Signed)
Electronic refill request, please advise  

## 2013-10-01 NOTE — Telephone Encounter (Signed)
Refilled electronically Please schedule f/u-I think it has been a year, thanks

## 2013-10-03 ENCOUNTER — Other Ambulatory Visit: Payer: Self-pay | Admitting: *Deleted

## 2013-10-03 NOTE — Telephone Encounter (Signed)
On 07/04/13 she got 60 with 4 refills left ---should she have 1 refill left on that ?

## 2013-10-03 NOTE — Telephone Encounter (Signed)
Pt called requesting tramadol refill, she also mentioned refills for flexeril, zoloft, and omeprazole. It appears those have been sent electronically.

## 2013-10-06 ENCOUNTER — Ambulatory Visit (INDEPENDENT_AMBULATORY_CARE_PROVIDER_SITE_OTHER): Payer: Federal, State, Local not specified - PPO | Admitting: Family Medicine

## 2013-10-06 ENCOUNTER — Encounter: Payer: Self-pay | Admitting: Family Medicine

## 2013-10-06 VITALS — BP 110/68 | HR 102 | Temp 99.2°F | Ht 68.75 in | Wt 181.2 lb

## 2013-10-06 DIAGNOSIS — IMO0001 Reserved for inherently not codable concepts without codable children: Secondary | ICD-10-CM

## 2013-10-06 DIAGNOSIS — D72829 Elevated white blood cell count, unspecified: Secondary | ICD-10-CM

## 2013-10-06 DIAGNOSIS — E876 Hypokalemia: Secondary | ICD-10-CM

## 2013-10-06 DIAGNOSIS — Z23 Encounter for immunization: Secondary | ICD-10-CM

## 2013-10-06 DIAGNOSIS — E785 Hyperlipidemia, unspecified: Secondary | ICD-10-CM

## 2013-10-06 DIAGNOSIS — F17200 Nicotine dependence, unspecified, uncomplicated: Secondary | ICD-10-CM

## 2013-10-06 MED ORDER — POTASSIUM CHLORIDE ER 10 MEQ PO CPCR: 20.0000 meq | ORAL_CAPSULE | Freq: Every day | ORAL | Status: DC

## 2013-10-06 MED ORDER — SIMVASTATIN 20 MG PO TABS: 20.0000 mg | ORAL_TABLET | Freq: Every day | ORAL | Status: DC

## 2013-10-06 MED ORDER — CYCLOBENZAPRINE HCL 10 MG PO TABS: 10.0000 mg | ORAL_TABLET | Freq: Three times a day (TID) | ORAL | Status: DC

## 2013-10-06 MED ORDER — TRAMADOL HCL 50 MG PO TABS: 50.0000 mg | ORAL_TABLET | Freq: Two times a day (BID) | ORAL | Status: DC | PRN

## 2013-10-06 NOTE — Telephone Encounter (Signed)
Rx declined, I spoke to pharmacy and pt still has 2 refills left, doesn't need refill

## 2013-10-06 NOTE — Assessment & Plan Note (Signed)
Lab today on supplement

## 2013-10-06 NOTE — Assessment & Plan Note (Signed)
Lipid panel today on statin and diet No side eff or problems Diet is fair and improving

## 2013-10-06 NOTE — Assessment & Plan Note (Signed)
Disc in detail risks of smoking and possible outcomes including copd, vascular/ heart disease, cancer , respiratory and sinus infections  Pt voices understanding  Commended on cutting down Pt is not ready to quit yet

## 2013-10-06 NOTE — Progress Notes (Signed)
Pre-visit discussion using our clinic review tool. No additional management support is needed unless otherwise documented below in the visit note.  
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
## 2013-10-06 NOTE — Assessment & Plan Note (Signed)
Overall stable  Reviewed meds- flexeril and tramadol - and disc the controlled subst agreement  Enc low impact exercise

## 2013-10-06 NOTE — Patient Instructions (Signed)
Labs today Flu vaccine today Keep thinking about quitting smoking  Keep working on the soda issue  Stay as active as you can be

## 2013-10-06 NOTE — Progress Notes (Signed)
Subjective:    Patient ID: Angela Petty, female    DOB: [birth year], 52 y.o.   MRN: [id number]  HPI Doing ok  Here for chronic health issues   Wt is down 7 lb - with bmi of 26  She cut back on  cokes (was drinking 80-12 per day) - down to 4 or less  Drinking more water   Temp 99.2 - not sick with anything  She was having a hot flashes  Wants to have a flu shot today   Due for labs -cholesterol - zocor and diet  Lab Results  Component Value Date   CHOL 183 10/08/2012   HDL 43.40 10/08/2012   LDLCALC 105* 10/08/2012   LDLDIRECT 134.9 10/18/2011   TRIG 175.0* 10/08/2012   CHOLHDL 4 10/08/2012    Hx of low K and on supplement   Fibromyalgia is about the same  Some days are worse than others - weather changes   Declines most screening tests at this time - see health mt chart   Has had pneumovax  Will have flu vaccine today  Still smokes 1/2 ppd - has cut down a lot but not ready to quit Breathing is about the same   Patient Active Problem List   Diagnosis Date Noted  . LEUKOCYTOSIS 11/01/2010  . HYPOKALEMIA 12/20/2009  . HYPERLIPIDEMIA 11/30/2009  . IBS 11/30/2009  . TOBACCO USE 08/26/2008  . DEPRESSION 08/26/2008  . FIBROMYALGIA 08/26/2008  . FATIGUE 08/26/2008   Past Medical History  Diagnosis Date  . Depression   . GERD (gastroesophageal reflux disease)   . Hyperlipidemia   . Fibromyalgia   . Tobacco abuse   . Carpal tunnel syndrome   . Hypokalemia   . 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 Leg surgery  age 58    leg surgery with screws placed  . Hip fracture surgery  1995    left   History  Substance Use Topics  . Smoking status: Current Every Day Smoker -- 0.50 packs/day  . Smokeless tobacco: Not on file  . Alcohol Use: No   Family History  Problem Relation Age of Onset  . Obesity Father   . Arthritis Father   . Heart disease Paternal Uncle     MI  . Diabetes Maternal Grandmother   .  Heart disease Paternal Grandfather     MI  . Cancer Cousin     leukemia   Allergies  Allergen Reactions  . Diclofenac Sodium     REACTION: GI  . Ibuprofen     REACTION: GI  . Naproxen Sodium     REACTION: GI, and did not work  . Rofecoxib     REACTION: GI   Current Outpatient Prescriptions on File Prior to Visit  Medication Sig Dispense Refill  . cyclobenzaprine (FLEXERIL) 10 MG tablet TAKE 1 TABLET BY MOUTH THREE TIMES DAILY AS NEEDED  90 tablet  0  . Multiple Vitamin (MULTIVITAMIN) tablet Take 1 tablet by mouth daily.        Marland Kitchen omeprazole (PRILOSEC) 20 MG capsule TAKE ONE CAPSULE BY MOUTH EVERY DAY  30 capsule  5  . potassium chloride (MICRO-K) 10 MEQ CR capsule Take 2 capsules (20 mEq total) by mouth daily.  60 capsule  11  . sertraline (ZOLOFT) 100 MG tablet TAKE 1 1/2 TABLETS BY MOUTH ONCE DAILY  45 tablet  5  . simvastatin (ZOCOR) 20 MG tablet Take  1 tablet (20 mg total) by mouth at bedtime.  30 tablet  11  . traMADol (ULTRAM) 50 MG tablet TAKE 1 TABLET BY MOUTH TWICE A DAY AS NEEDED  60 tablet  4   No current facility-administered medications on file prior to visit.       Review of Systems Review of Systems  Constitutional: Negative for fever, appetite change, and unexpected weight change.pos for chronic pain   Eyes: Negative for pain and visual disturbance.  Respiratory: Negative for cough and shortness of breath.   Cardiovascular: Negative for cp or palpitations    Gastrointestinal: Negative for nausea, diarrhea and constipation.  Genitourinary: Negative for urgency and frequency. pos for menopausal hot flashes  Skin: Negative for pallor or rash MSK pos for chronic myofasical pain without joint swelling    Neurological: Negative for weakness, light-headedness, numbness and headaches.  Hematological: Negative for adenopathy. Does not bruise/bleed easily.  Psychiatric/Behavioral: pos for depression and anxiety that are controlled .         Objective:   Physical  Exam  Constitutional: She appears well-developed and well-nourished. No distress.  HENT:  Head: Normocephalic and atraumatic.  Right Ear: External ear normal.  Left Ear: External ear normal.  Nose: Nose normal.  Mouth/Throat: Oropharynx is clear and moist.  Eyes: Conjunctivae and EOM are normal. Pupils are equal, round, and reactive to light. Right eye exhibits no discharge. Left eye exhibits no discharge. No scleral icterus.  Neck: Normal range of motion. Neck supple. No JVD present. No thyromegaly present.  Cardiovascular: Normal rate, regular rhythm, normal heart sounds and intact distal pulses.  Exam reveals no gallop.   Pulmonary/Chest: Effort normal and breath sounds normal. No respiratory distress. She has no wheezes. She has no rales.  Abdominal: Soft. Bowel sounds are normal. She exhibits no distension and no mass. There is no tenderness.  Musculoskeletal: She exhibits tenderness. She exhibits no edema.  Myofascial trigger points unchanged   Lymphadenopathy:    She has no cervical adenopathy.  Neurological: She is alert. She has normal reflexes. No cranial nerve deficit. She exhibits normal muscle tone. Coordination normal.  Skin: Skin is warm and dry. No rash noted. No erythema. No pallor.  Psychiatric: She has a normal mood and affect.  Cheerful today          Assessment & Plan:

## 2013-10-07 LAB — COMPREHENSIVE METABOLIC PANEL
Albumin: 4.3 g/dL (ref 3.5–5.2)
Alkaline Phosphatase: 66 U/L (ref 39–117)
CO2: 30 mEq/L (ref 19–32)
Chloride: 101 mEq/L (ref 96–112)
Glucose, Bld: 90 mg/dL (ref 70–99)
Potassium: 4.3 mEq/L (ref 3.5–5.1)
Sodium: 139 mEq/L (ref 135–145)
Total Protein: 7.6 g/dL (ref 6.0–8.3)

## 2013-10-07 LAB — CBC WITH DIFFERENTIAL/PLATELET
Basophils Absolute: 0.1 10*3/uL (ref 0.0–0.1)
HCT: 40.1 % (ref 36.0–46.0)
Lymphs Abs: 3.1 10*3/uL (ref 0.7–4.0)
Monocytes Absolute: 0.7 10*3/uL (ref 0.1–1.0)
Monocytes Relative: 7.3 % (ref 3.0–12.0)
Platelets: 283 10*3/uL (ref 150.0–400.0)
RDW: 12.9 % (ref 11.5–14.6)

## 2013-10-07 LAB — LIPID PANEL
Cholesterol: 217 mg/dL — ABNORMAL HIGH (ref 0–200)
Total CHOL/HDL Ratio: 5

## 2013-10-07 LAB — TSH: TSH: 2.24 u[IU]/mL (ref 0.35–5.50)

## 2013-10-09 ENCOUNTER — Other Ambulatory Visit: Payer: Self-pay | Admitting: Family Medicine

## 2013-10-13 ENCOUNTER — Other Ambulatory Visit: Payer: Self-pay

## 2013-10-13 MED ORDER — SIMVASTATIN 20 MG PO TABS: 40.0000 mg | ORAL_TABLET | Freq: Every day | ORAL | Status: DC

## 2013-10-15 ENCOUNTER — Telehealth: Payer: Self-pay

## 2013-10-15 DIAGNOSIS — M25511 Pain in right shoulder: Secondary | ICD-10-CM

## 2013-10-15 NOTE — Telephone Encounter (Signed)
Remind me what arm- left or right, and I will refer

## 2013-10-15 NOTE — Addendum Note (Signed)
Addended by: Roxy Manns A on: 10/15/2013 01:37 PM   Modules accepted: Orders

## 2013-10-15 NOTE — Telephone Encounter (Signed)
Pt left v/m was seen 10/06/13 and pt request referral for orthopedic, Dr Dareen Piano. Pt having difficulty lifting her arm;rotator cuff. Dr Dareen Piano fax # [phone] and phone # [phone].

## 2013-10-15 NOTE — Telephone Encounter (Signed)
It's pt's Right arm and Angela Petty (Pt's girlfriend on [place]) advise that Dr. Milinda Antis put referral in and Marion/Linda will call pt to schedule appt

## 2013-10-20 NOTE — Telephone Encounter (Signed)
Called patient to help make the referral for patient and she said her shoulder was feeling better so she doesn't want the appt at this time.

## 2013-10-20 NOTE — Telephone Encounter (Signed)
Called patient and she is feeling better and doesn't want the referral at this time.

## 2013-10-30 ENCOUNTER — Other Ambulatory Visit: Payer: Self-pay | Admitting: Family Medicine

## 2013-10-31 ENCOUNTER — Other Ambulatory Visit: Payer: Self-pay | Admitting: Family Medicine

## 2013-10-31 DIAGNOSIS — E785 Hyperlipidemia, unspecified: Secondary | ICD-10-CM

## 2013-11-10 ENCOUNTER — Encounter: Payer: Self-pay | Admitting: Radiology

## 2013-11-11 ENCOUNTER — Other Ambulatory Visit: Payer: Federal, State, Local not specified - PPO | Admitting: Family Medicine

## 2013-11-11 ENCOUNTER — Other Ambulatory Visit (INDEPENDENT_AMBULATORY_CARE_PROVIDER_SITE_OTHER): Payer: Federal, State, Local not specified - PPO

## 2013-11-11 DIAGNOSIS — E785 Hyperlipidemia, unspecified: Secondary | ICD-10-CM

## 2013-11-11 LAB — ALT: ALT: 32 U/L (ref 0–35)

## 2013-11-11 LAB — LDL CHOLESTEROL, DIRECT: LDL DIRECT: 130.8 mg/dL

## 2013-11-11 LAB — LIPID PANEL
Cholesterol: 197 mg/dL (ref 0–200)
HDL: 43.9 mg/dL (ref >39.00)
Total CHOL/HDL Ratio: 4
Triglycerides: 207 mg/dL — ABNORMAL HIGH (ref 0.0–149.0)
VLDL: 41.4 mg/dL — ABNORMAL HIGH (ref 0.0–40.0)

## 2013-11-11 LAB — AST: AST: 27 U/L (ref 0–37)

## 2013-11-13 ENCOUNTER — Encounter: Payer: Self-pay | Admitting: *Deleted

## 2013-12-03 ENCOUNTER — Encounter: Payer: Self-pay | Admitting: Family Medicine

## 2014-02-09 ENCOUNTER — Encounter (HOSPITAL_COMMUNITY): Payer: Self-pay | Admitting: Emergency Medicine

## 2014-02-09 DIAGNOSIS — F17200 Nicotine dependence, unspecified, uncomplicated: Secondary | ICD-10-CM | POA: Insufficient documentation

## 2014-02-09 DIAGNOSIS — R51 Headache: Secondary | ICD-10-CM | POA: Insufficient documentation

## 2014-02-09 DIAGNOSIS — R112 Nausea with vomiting, unspecified: Secondary | ICD-10-CM | POA: Insufficient documentation

## 2014-02-09 DIAGNOSIS — R300 Dysuria: Secondary | ICD-10-CM | POA: Insufficient documentation

## 2014-02-09 LAB — COMPREHENSIVE METABOLIC PANEL
ALBUMIN: 3.9 g/dL (ref 3.5–5.2)
ALK PHOS: 71 U/L (ref 39–117)
ALT: 22 U/L (ref 0–35)
AST: 23 U/L (ref 0–37)
BILIRUBIN TOTAL: 0.2 mg/dL — AB (ref 0.3–1.2)
BUN: 8 mg/dL (ref 6–23)
CHLORIDE: 103 meq/L (ref 96–112)
CO2: 24 mEq/L (ref 19–32)
Calcium: 8.7 mg/dL (ref 8.4–10.5)
Creatinine, Ser: 0.56 mg/dL (ref 0.50–1.10)
GFR calc Af Amer: >90 mL/min (ref >90)
GFR calc non Af Amer: >90 mL/min (ref >90)
GLUCOSE: 136 mg/dL — AB (ref 70–99)
POTASSIUM: 3.8 meq/L (ref 3.7–5.3)
SODIUM: 143 meq/L (ref 137–147)
Total Protein: 7 g/dL (ref 6.0–8.3)

## 2014-02-09 LAB — CBC WITH DIFFERENTIAL/PLATELET
BASOS PCT: 0 % (ref 0–1)
Basophils Absolute: 0 10*3/uL (ref 0.0–0.1)
EOS ABS: 0.2 10*3/uL (ref 0.0–0.7)
Eosinophils Relative: 1 % (ref 0–5)
HCT: 40.4 % (ref 36.0–46.0)
Hemoglobin: 14.4 g/dL (ref 12.0–15.0)
LYMPHS ABS: 1.9 10*3/uL (ref 0.7–4.0)
Lymphocytes Relative: 14 % (ref 12–46)
MCH: 31.3 pg (ref 26.0–34.0)
MCHC: 35.6 g/dL (ref 30.0–36.0)
MCV: 87.8 fL (ref 78.0–100.0)
Monocytes Absolute: 0.7 10*3/uL (ref 0.1–1.0)
Monocytes Relative: 5 % (ref 3–12)
NEUTROS ABS: 11.1 10*3/uL — AB (ref 1.7–7.7)
NEUTROS PCT: 80 % — AB (ref 43–77)
PLATELETS: 214 10*3/uL (ref 150–400)
RBC: 4.6 MIL/uL (ref 3.87–5.11)
RDW: 12.7 % (ref 11.5–15.5)
WBC: 13.9 10*3/uL — ABNORMAL HIGH (ref 4.0–10.5)

## 2014-02-09 NOTE — ED Notes (Signed)
Pt reports n/v that started about an hour ago. Reports the last time she felt this way her potassium was low. States that she takes potassium pills. Denies diarrhea, but has had some burning with urination.

## 2014-02-10 ENCOUNTER — Emergency Department (HOSPITAL_COMMUNITY)
Admission: EM | Admit: 2014-02-10 | Discharge: 2014-02-10 | Discharge status: Against Medical Advice | Payer: Federal, State, Local not specified - PPO | Attending: Emergency Medicine | Admitting: Emergency Medicine

## 2014-02-10 NOTE — ED Notes (Signed)
Pt called for room with no answer x 2. 

## 2014-02-10 NOTE — ED Notes (Signed)
Pt called for room with no answer x1 

## 2014-02-10 NOTE — ED Notes (Signed)
Pt called for room with no answer x 3 

## 2014-03-11 ENCOUNTER — Other Ambulatory Visit: Payer: Self-pay | Admitting: Family Medicine

## 2014-04-01 ENCOUNTER — Other Ambulatory Visit: Payer: Self-pay | Admitting: Family Medicine

## 2014-04-01 NOTE — Telephone Encounter (Signed)
Electronic refill request, please advise  

## 2014-04-01 NOTE — Telephone Encounter (Signed)
Please refill until dec , schedule f/u or PE in dec -thanks

## 2014-04-02 NOTE — Telephone Encounter (Signed)
Pt notified she will be due for a f/u or PE in Dec. Pt will call back and schedule a f/u closer to Dec. Rx refilled for 5 months so when next refill is due (in Nov.) if pt forgets we will call and get f/u scheduled then

## 2014-04-05 ENCOUNTER — Other Ambulatory Visit: Payer: Self-pay | Admitting: Family Medicine

## 2014-04-06 ENCOUNTER — Other Ambulatory Visit: Payer: Self-pay | Admitting: Family Medicine

## 2014-04-06 NOTE — Telephone Encounter (Signed)
done

## 2014-04-06 NOTE — Telephone Encounter (Signed)
Electronic refill request, last OV was 10/06/13, please advise

## 2014-04-06 NOTE — Telephone Encounter (Signed)
Please refill for 6 mo 

## 2014-04-07 ENCOUNTER — Other Ambulatory Visit: Payer: Self-pay | Admitting: Family Medicine

## 2014-04-07 NOTE — Telephone Encounter (Signed)
Will refill electronically  

## 2014-04-07 NOTE — Telephone Encounter (Signed)
Electronic refill request, please advise  

## 2014-04-08 ENCOUNTER — Other Ambulatory Visit: Payer: Self-pay | Admitting: Family Medicine

## 2014-05-03 ENCOUNTER — Other Ambulatory Visit: Payer: Self-pay | Admitting: Family Medicine

## 2014-05-04 NOTE — Telephone Encounter (Signed)
Okay cyclobenzaprine #90 x 0 Tramadol #60 x 0

## 2014-05-04 NOTE — Telephone Encounter (Signed)
Electronic refill request, please advise  

## 2014-05-04 NOTE — Telephone Encounter (Signed)
rx called into pharmacy rx sent to pharmacy by e-script  

## 2014-06-01 ENCOUNTER — Other Ambulatory Visit: Payer: Self-pay | Admitting: Internal Medicine

## 2014-06-01 NOTE — Telephone Encounter (Signed)
Rx called in as prescribed 

## 2014-06-01 NOTE — Telephone Encounter (Signed)
Px written for call in   

## 2014-06-01 NOTE — Telephone Encounter (Signed)
Electronic refill request, please advise  

## 2014-07-02 ENCOUNTER — Other Ambulatory Visit: Payer: Self-pay | Admitting: *Deleted

## 2014-07-02 MED ORDER — SERTRALINE HCL 100 MG PO TABS: ORAL_TABLET | ORAL | Status: DC

## 2014-07-02 MED ORDER — SIMVASTATIN 20 MG PO TABS: 40.0000 mg | ORAL_TABLET | Freq: Every day | ORAL | Status: DC

## 2014-07-02 MED ORDER — POTASSIUM CHLORIDE ER 10 MEQ PO CPCR: 20.0000 meq | ORAL_CAPSULE | Freq: Every day | ORAL | Status: DC

## 2014-07-02 MED ORDER — OMEPRAZOLE 20 MG PO CPDR: DELAYED_RELEASE_CAPSULE | ORAL | Status: DC

## 2014-07-02 NOTE — Telephone Encounter (Signed)
Please refill all for 6 mo, thanks

## 2014-07-02 NOTE — Telephone Encounter (Signed)
Received fax from pharmacy saying pt is requesting 90 day supplies on her Rxs instead of 30 day supplies, please advise

## 2014-07-02 NOTE — Telephone Encounter (Signed)
done

## 2014-11-04 ENCOUNTER — Other Ambulatory Visit: Payer: Self-pay | Admitting: Family Medicine

## 2014-11-04 NOTE — Telephone Encounter (Signed)
Please schedule winter f/u and refill until then Thanks  

## 2014-11-04 NOTE — Telephone Encounter (Signed)
Electronic refill request, please advise  

## 2014-11-05 ENCOUNTER — Encounter: Payer: Self-pay | Admitting: *Deleted

## 2014-11-05 MED ORDER — CYCLOBENZAPRINE HCL 10 MG PO TABS: 10.0000 mg | ORAL_TABLET | Freq: Three times a day (TID) | ORAL | Status: DC | PRN

## 2014-11-05 NOTE — Telephone Encounter (Signed)
Since it is holiday weekend and we don't return until Monday, I authorized #[id number]. I added a comment to rx that pt needs to schedule follow up appt.

## 2014-11-10 NOTE — Telephone Encounter (Signed)
Encounter opened in error, refill already addressed in another encounter.

## 2014-11-30 ENCOUNTER — Ambulatory Visit: Payer: Federal, State, Local not specified - PPO | Admitting: Family Medicine

## 2014-12-02 ENCOUNTER — Other Ambulatory Visit: Payer: Self-pay | Admitting: Family Medicine

## 2014-12-02 NOTE — Telephone Encounter (Signed)
Please refill times one Thanks  

## 2014-12-02 NOTE — Telephone Encounter (Signed)
Ok to refill? Appt for med refill on 12/04/14.

## 2014-12-04 ENCOUNTER — Ambulatory Visit (INDEPENDENT_AMBULATORY_CARE_PROVIDER_SITE_OTHER): Payer: Federal, State, Local not specified - PPO | Admitting: Family Medicine

## 2014-12-04 ENCOUNTER — Encounter: Payer: Self-pay | Admitting: Family Medicine

## 2014-12-04 VITALS — BP 108/70 | HR 95 | Temp 98.2°F | Ht 68.75 in | Wt 181.4 lb

## 2014-12-04 DIAGNOSIS — Z720 Tobacco use: Secondary | ICD-10-CM

## 2014-12-04 DIAGNOSIS — Z23 Encounter for immunization: Secondary | ICD-10-CM

## 2014-12-04 DIAGNOSIS — F17200 Nicotine dependence, unspecified, uncomplicated: Secondary | ICD-10-CM

## 2014-12-04 DIAGNOSIS — M797 Fibromyalgia: Secondary | ICD-10-CM

## 2014-12-04 DIAGNOSIS — M654 Radial styloid tenosynovitis [de Quervain]: Secondary | ICD-10-CM | POA: Insufficient documentation

## 2014-12-04 DIAGNOSIS — E785 Hyperlipidemia, unspecified: Secondary | ICD-10-CM

## 2014-12-04 DIAGNOSIS — E876 Hypokalemia: Secondary | ICD-10-CM

## 2014-12-04 MED ORDER — SIMVASTATIN 20 MG PO TABS: 40.0000 mg | ORAL_TABLET | Freq: Every day | ORAL | Status: DC

## 2014-12-04 MED ORDER — TRAMADOL HCL 50 MG PO TABS: 50.0000 mg | ORAL_TABLET | Freq: Two times a day (BID) | ORAL | Status: DC | PRN

## 2014-12-04 MED ORDER — OMEPRAZOLE 20 MG PO CPDR: DELAYED_RELEASE_CAPSULE | ORAL | Status: DC

## 2014-12-04 MED ORDER — POTASSIUM CHLORIDE ER 10 MEQ PO CPCR: 20.0000 meq | ORAL_CAPSULE | Freq: Every day | ORAL | Status: DC

## 2014-12-04 MED ORDER — SERTRALINE HCL 100 MG PO TABS: ORAL_TABLET | ORAL | Status: DC

## 2014-12-04 NOTE — Patient Instructions (Addendum)
Flu shot today For wrist- get a wrist splint at the drug store (like for carpal tunnel)  Wear it as much as you can  Ice area when able  Try ibuprofen otc 2 pills every 8 hours with food as tolerated- stop when it bothers stomach Let me know if wrist does not improve in 2 weeks  Make fasting appt for labs and drug screen on the way out  Eat healthy diet

## 2014-12-04 NOTE — Progress Notes (Signed)
Subjective:    Patient ID: Angela FordHolly M Behar, female    DOB: 01/28/1961, 54 y.o.   MRN: [id number]  HPI Here for f/u of chronic medical problems  Fibromyalgia Overall getting a little worse - more issues with hands and shoulders/neck and knees  Her L wrist hurts to use and burns -no meds , maybe a little swollen   Wt is stable with bmi of 26  Smoking - the same  No intent to quit   Mood-stable / no change   Hyperlipidemia zocor and diet Lab Results  Component Value Date   CHOL 197 11/11/2013   HDL 43.90 11/11/2013   LDLCALC 105* 10/08/2012   LDLDIRECT 130.8 11/11/2013   TRIG 207.0* 11/11/2013   CHOLHDL 4 11/11/2013    Due for labs - wants to schedule fasting labs for that   Flu shot needs today   Patient Active Problem List   Diagnosis Date Noted  . Right shoulder pain 10/15/2013  . LEUKOCYTOSIS 11/01/2010  . HYPOKALEMIA 12/20/2009  . HYPERLIPIDEMIA 11/30/2009  . IBS 11/30/2009  . TOBACCO USE 08/26/2008  . DEPRESSION 08/26/2008  . FIBROMYALGIA 08/26/2008  . FATIGUE 08/26/2008   Past Medical History  Diagnosis Date  . Depression   . GERD (gastroesophageal reflux disease)   . Hyperlipidemia   . Fibromyalgia   . Tobacco abuse   . Carpal tunnel syndrome   . Hypokalemia   . 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 Leg surgery  age 54    leg surgery with screws placed  . Hip fracture surgery  1995    left   History  Substance Use Topics  . Smoking status: Current Every Day Smoker -- 0.50 packs/day    Types: Cigarettes  . Smokeless tobacco: Not on file  . Alcohol Use: No   Family History  Problem Relation Age of Onset  . Obesity Father   . Arthritis Father   . Heart disease Paternal Uncle     MI  . Diabetes Maternal Grandmother   . Heart disease Paternal Grandfather     MI  . Cancer Cousin     leukemia   Allergies  Allergen Reactions  . Diclofenac Sodium     REACTION: GI  . Ibuprofen    REACTION: GI  . Naproxen Sodium     REACTION: GI, and did not work  . Rofecoxib     REACTION: GI   Current Outpatient Prescriptions on File Prior to Visit  Medication Sig Dispense Refill  . cyclobenzaprine (FLEXERIL) 10 MG tablet TAKE 1 TABLET BY MOUTH 3 TIMES A DAY AS NEEDED FOR MUSCLE SPASM NEED FOLLOW UP APPT 90 tablet 1  . Multiple Vitamin (MULTIVITAMIN) tablet Take 1 tablet by mouth daily.      Marland Kitchen. omeprazole (PRILOSEC) 20 MG capsule TAKE ONE CAPSULE BY MOUTH EVERY DAY 90 capsule 1  . potassium chloride (MICRO-K) 10 MEQ CR capsule Take 2 capsules (20 mEq total) by mouth daily. 180 capsule 1  . sertraline (ZOLOFT) 100 MG tablet TAKE 1 AND 1/2 TABS ONCE DAILY 135 tablet 1  . simvastatin (ZOCOR) 20 MG tablet Take 2 tablets (40 mg total) by mouth at bedtime. 180 tablet 1  . traMADol (ULTRAM) 50 MG tablet TAKE 1 TABLET TWICE A DAY AS NEEDED 60 tablet 5   No current facility-administered medications on file prior to visit.     Review of Systems Review of Systems  Constitutional: Negative  for fever, appetite change, fatigue and unexpected weight change.  Eyes: Negative for pain and visual disturbance.  Respiratory: Negative for cough and shortness of breath.   Cardiovascular: Negative for cp or palpitations    Gastrointestinal: Negative for nausea, diarrhea and constipation.  Genitourinary: Negative for urgency and frequency.  Skin: Negative for pallor or rash   MSK pos for myofascial pain , pos for L wrist pain  Neurological: Negative for weakness, light-headedness, numbness and headaches.  Hematological: Negative for adenopathy. Does not bruise/bleed easily.  Psychiatric/Behavioral: Negative for dysphoric mood. The patient is not nervous/anxious.         Objective:   Physical Exam  Constitutional: She appears well-developed and well-nourished. No distress.  HENT:  Head: Normocephalic and atraumatic.  Mouth/Throat: Oropharynx is clear and moist.  Eyes: Conjunctivae and EOM are  normal. Pupils are equal, round, and reactive to light. Right eye exhibits no discharge. Left eye exhibits no discharge.  Neck: Normal range of motion. Neck supple. No JVD present. Carotid bruit is not present. No thyromegaly present.  Cardiovascular: Normal rate, regular rhythm, normal heart sounds and intact distal pulses.  Exam reveals no gallop.   Pulmonary/Chest: Effort normal and breath sounds normal. No respiratory distress. She has no wheezes. She has no rales.  Diffusely distant bs   Abdominal: Soft. Bowel sounds are normal. She exhibits no distension and no mass. There is no tenderness.  Musculoskeletal: She exhibits tenderness. She exhibits no edema.  Tenderness in wrist at base of first MCP Pos Finklestien test  Nl rom  Nl grip with discomfort No swelling   Pos myofascial trigger points   Lymphadenopathy:    She has no cervical adenopathy.  Neurological: She is alert. She has normal reflexes. No cranial nerve deficit. She exhibits normal muscle tone. Coordination normal.  Skin: Skin is warm and dry. No rash noted. No erythema. No pallor.  Psychiatric: She has a normal mood and affect.          Assessment & Plan:   Problem List Items Addressed This Visit      Musculoskeletal and Integument   De Quervain's tenosynovitis, left - Primary    Per pt she has had this before  She will get a wrist splint (may already have one) and wear as much as possible) Disc use of cold compresses and relative rest  Update if worse or no improvement       Fibromyalgia    Worse in the past 6 mo with season/weather change  No exercise  No change in exam  Rev medicines         Other   Hyperlipidemia    Plan fasting lab for lipids  Statin and diet  Rev low sat fat diet in detail  Renew simvastatin       Relevant Medications   simvastatin (ZOCOR) tablet   Other Relevant Orders   CBC with Differential/Platelet   Comprehensive metabolic panel   Lipid panel   TSH    HYPOKALEMIA    Lab visit planned  No cramping        Relevant Orders   CBC with Differential/Platelet   Comprehensive metabolic panel   TOBACCO USE    Disc in detail risks of smoking and possible outcomes including copd, vascular/ heart disease, cancer , respiratory and sinus infections  Pt voices understanding Pt does not want to quit at this time        Other Visit Diagnoses    Flu vaccine need  Relevant Orders    Flu Vaccine QUAD 36+ mos IM (Completed)

## 2014-12-04 NOTE — Progress Notes (Signed)
Pre visit review using our clinic review tool, if applicable. No additional management support is needed unless otherwise documented below in the visit note. 

## 2014-12-06 NOTE — Assessment & Plan Note (Signed)
Worse in the past 6 mo with season/weather change  No exercise  No change in exam  Rev medicines

## 2014-12-06 NOTE — Assessment & Plan Note (Signed)
Disc in detail risks of smoking and possible outcomes including copd, vascular/ heart disease, cancer , respiratory and sinus infections  Pt voices understanding Pt does not want to quit at this time 

## 2014-12-06 NOTE — Assessment & Plan Note (Signed)
Per pt she has had this before  She will get a wrist splint (may already have one) and wear as much as possible) Disc use of cold compresses and relative rest  Update if worse or no improvement

## 2014-12-06 NOTE — Assessment & Plan Note (Signed)
Lab visit planned  No cramping

## 2014-12-06 NOTE — Assessment & Plan Note (Signed)
Plan fasting lab for lipids  Statin and diet  Rev low sat fat diet in detail  Renew simvastatin

## 2014-12-07 ENCOUNTER — Telehealth: Payer: Self-pay | Admitting: Family Medicine

## 2014-12-07 NOTE — Telephone Encounter (Signed)
emmi mailed  °

## 2014-12-18 ENCOUNTER — Other Ambulatory Visit (INDEPENDENT_AMBULATORY_CARE_PROVIDER_SITE_OTHER): Payer: Federal, State, Local not specified - PPO

## 2014-12-18 DIAGNOSIS — E876 Hypokalemia: Secondary | ICD-10-CM

## 2014-12-18 DIAGNOSIS — E785 Hyperlipidemia, unspecified: Secondary | ICD-10-CM

## 2014-12-18 LAB — CBC WITH DIFFERENTIAL/PLATELET
BASOS ABS: 0 10*3/uL (ref 0.0–0.1)
BASOS PCT: 0.4 % (ref 0.0–3.0)
Eosinophils Absolute: 0.3 10*3/uL (ref 0.0–0.7)
Eosinophils Relative: 4.6 % (ref 0.0–5.0)
HCT: 40.6 % (ref 36.0–46.0)
Hemoglobin: 14 g/dL (ref 12.0–15.0)
LYMPHS PCT: 31.8 % (ref 12.0–46.0)
Lymphs Abs: 2.4 10*3/uL (ref 0.7–4.0)
MCHC: 34.4 g/dL (ref 30.0–36.0)
MCV: 88.8 fl (ref 78.0–100.0)
Monocytes Absolute: 0.5 10*3/uL (ref 0.1–1.0)
Monocytes Relative: 7.2 % (ref 3.0–12.0)
Neutro Abs: 4.2 10*3/uL (ref 1.4–7.7)
Neutrophils Relative %: 56 % (ref 43.0–77.0)
Platelets: 249 10*3/uL (ref 150.0–400.0)
RBC: 4.58 Mil/uL (ref 3.87–5.11)
RDW: 13.4 % (ref 11.5–15.5)
WBC: 7.5 10*3/uL (ref 4.0–10.5)

## 2014-12-18 LAB — COMPREHENSIVE METABOLIC PANEL
ALK PHOS: 73 U/L (ref 39–117)
ALT: 25 U/L (ref 0–35)
AST: 20 U/L (ref 0–37)
Albumin: 4.3 g/dL (ref 3.5–5.2)
BUN: 14 mg/dL (ref 6–23)
CO2: 30 mEq/L (ref 19–32)
CREATININE: 0.75 mg/dL (ref 0.40–1.20)
Calcium: 9.5 mg/dL (ref 8.4–10.5)
Chloride: 103 mEq/L (ref 96–112)
GFR: 86.32 mL/min (ref >60.00)
Glucose, Bld: 109 mg/dL — ABNORMAL HIGH (ref 70–99)
Potassium: 3.9 mEq/L (ref 3.5–5.1)
Sodium: 138 mEq/L (ref 135–145)
Total Bilirubin: 0.5 mg/dL (ref 0.2–1.2)
Total Protein: 7.4 g/dL (ref 6.0–8.3)

## 2014-12-18 LAB — LIPID PANEL
CHOL/HDL RATIO: 4
CHOLESTEROL: 192 mg/dL (ref 0–200)
HDL: 48.9 mg/dL (ref >39.00)
LDL CALC: 112 mg/dL — AB (ref 0–99)
NONHDL: 143.1
Triglycerides: 154 mg/dL — ABNORMAL HIGH (ref 0.0–149.0)
VLDL: 30.8 mg/dL (ref 0.0–40.0)

## 2014-12-18 LAB — TSH: TSH: 2.62 u[IU]/mL (ref 0.35–4.50)

## 2014-12-21 ENCOUNTER — Encounter: Payer: Self-pay | Admitting: *Deleted

## 2015-01-17 ENCOUNTER — Other Ambulatory Visit: Payer: Self-pay | Admitting: Family Medicine

## 2015-01-26 ENCOUNTER — Other Ambulatory Visit: Payer: Self-pay | Admitting: Family Medicine

## 2015-01-26 NOTE — Telephone Encounter (Signed)
Patient left a voicemail stating that she is having a problem getting her flexeril refilled. Patient stated that she was told that she needs an appointment to be seen and she was just in about a month ago. Pharmacy [facility]. Call patient when this has been taken care of.

## 2015-01-26 NOTE — Telephone Encounter (Signed)
Patient aware rx sent to pharmacy.  

## 2015-01-26 NOTE — Telephone Encounter (Signed)
She should not have any problems -I sent it

## 2015-05-14 ENCOUNTER — Other Ambulatory Visit: Payer: Self-pay | Admitting: Family Medicine

## 2015-05-14 NOTE — Telephone Encounter (Signed)
done

## 2015-05-14 NOTE — Telephone Encounter (Signed)
Please refill for 6 mo 

## 2015-05-14 NOTE — Telephone Encounter (Signed)
Pt had med refill appt on 12/04/14 last refilled on 01/26/15 #90 with 3 additional refills, please advise

## 2015-06-06 ENCOUNTER — Other Ambulatory Visit: Payer: Self-pay | Admitting: Family Medicine

## 2015-06-07 NOTE — Telephone Encounter (Signed)
Electronic refill request, pt has a med refill appt on 12/04/14, last refilled on 12/04/14 #60 with 5 additional refills, please advise

## 2015-06-07 NOTE — Telephone Encounter (Signed)
Px written for call in   

## 2015-06-07 NOTE — Telephone Encounter (Signed)
Rx called in as prescribed 

## 2015-08-22 ENCOUNTER — Encounter (HOSPITAL_COMMUNITY): Payer: Self-pay | Admitting: Emergency Medicine

## 2015-08-22 ENCOUNTER — Emergency Department (HOSPITAL_COMMUNITY)
Admission: EM | Admit: 2015-08-22 | Discharge: 2015-08-22 | Disposition: A | Discharge status: Home / Self Care | Payer: Federal, State, Local not specified - PPO | Attending: Emergency Medicine | Admitting: Emergency Medicine

## 2015-08-22 DIAGNOSIS — Z8669 Personal history of other diseases of the nervous system and sense organs: Secondary | ICD-10-CM | POA: Insufficient documentation

## 2015-08-22 DIAGNOSIS — Z720 Tobacco use: Secondary | ICD-10-CM | POA: Insufficient documentation

## 2015-08-22 DIAGNOSIS — Z79899 Other long term (current) drug therapy: Secondary | ICD-10-CM | POA: Diagnosis not present

## 2015-08-22 DIAGNOSIS — Z87448 Personal history of other diseases of urinary system: Secondary | ICD-10-CM | POA: Insufficient documentation

## 2015-08-22 DIAGNOSIS — K219 Gastro-esophageal reflux disease without esophagitis: Secondary | ICD-10-CM | POA: Insufficient documentation

## 2015-08-22 DIAGNOSIS — E785 Hyperlipidemia, unspecified: Secondary | ICD-10-CM | POA: Diagnosis not present

## 2015-08-22 DIAGNOSIS — F329 Major depressive disorder, single episode, unspecified: Secondary | ICD-10-CM | POA: Insufficient documentation

## 2015-08-22 DIAGNOSIS — R42 Dizziness and giddiness: Secondary | ICD-10-CM | POA: Insufficient documentation

## 2015-08-22 DIAGNOSIS — E876 Hypokalemia: Secondary | ICD-10-CM | POA: Insufficient documentation

## 2015-08-22 DIAGNOSIS — Z8739 Personal history of other diseases of the musculoskeletal system and connective tissue: Secondary | ICD-10-CM | POA: Insufficient documentation

## 2015-08-22 LAB — BASIC METABOLIC PANEL
ANION GAP: 8 (ref 5–15)
BUN: <5 mg/dL — ABNORMAL LOW (ref 6–20)
CHLORIDE: 100 mmol/L — AB (ref 101–111)
CO2: 27 mmol/L (ref 22–32)
Calcium: 8.8 mg/dL — ABNORMAL LOW (ref 8.9–10.3)
Creatinine, Ser: 0.6 mg/dL (ref 0.44–1.00)
GFR calc Af Amer: >60 mL/min (ref >60)
GFR calc non Af Amer: >60 mL/min (ref >60)
Glucose, Bld: 126 mg/dL — ABNORMAL HIGH (ref 65–99)
Potassium: 3.8 mmol/L (ref 3.5–5.1)
SODIUM: 135 mmol/L (ref 135–145)

## 2015-08-22 LAB — CBC WITH DIFFERENTIAL/PLATELET
BASOS PCT: 0 %
Basophils Absolute: 0 10*3/uL (ref 0.0–0.1)
EOS ABS: 0.2 10*3/uL (ref 0.0–0.7)
Eosinophils Relative: 2 %
HCT: 39.1 % (ref 36.0–46.0)
Hemoglobin: 13.2 g/dL (ref 12.0–15.0)
LYMPHS ABS: 1.2 10*3/uL (ref 0.7–4.0)
LYMPHS PCT: 14 %
MCH: 29.9 pg (ref 26.0–34.0)
MCHC: 33.8 g/dL (ref 30.0–36.0)
MCV: 88.7 fL (ref 78.0–100.0)
MONOS PCT: 5 %
Monocytes Absolute: 0.4 10*3/uL (ref 0.1–1.0)
NEUTROS PCT: 79 %
Neutro Abs: 7.1 10*3/uL (ref 1.7–7.7)
PLATELETS: 221 10*3/uL (ref 150–400)
RBC: 4.41 MIL/uL (ref 3.87–5.11)
RDW: 12.7 % (ref 11.5–15.5)
WBC: 9 10*3/uL (ref 4.0–10.5)

## 2015-08-22 MED ORDER — SODIUM CHLORIDE 0.9 % IV BOLUS (SEPSIS)
1000.0000 mL | Freq: Once | INTRAVENOUS | Status: AC
  Administered 2015-08-22: 1000 mL via INTRAVENOUS

## 2015-08-22 MED ORDER — MECLIZINE HCL 25 MG PO TABS: 25.0000 mg | ORAL_TABLET | Freq: Three times a day (TID) | ORAL | Status: DC | PRN

## 2015-08-22 MED ORDER — MECLIZINE HCL 25 MG PO TABS
25.0000 mg | ORAL_TABLET | Freq: Once | ORAL | Status: AC
  Administered 2015-08-22: 25 mg via ORAL
  Filled 2015-08-22: qty 1

## 2015-08-22 NOTE — ED Notes (Signed)
Pt states last night around 10pm she walked outside and became very dizzy and felt like she was going to fall. Pt states she laid down and went to sleep and woke up feeling the same way. Pt states she feels ,"very loopy" and has a mild headache 6/10. Pt is alert and ox4. No neuro deficits noted at triage.

## 2015-08-22 NOTE — ED Provider Notes (Signed)
CSN: [id number]     Arrival date & time 08/22/15  1000 History   First MD Initiated Contact with Patient 08/22/15 1016     Chief Complaint  Patient presents with  . Dizziness     (Consider location/radiation/quality/duration/timing/severity/associated sxs/prior Treatment) HPI Comments: Patient is a 54 year old female with past medical history of depression, fibromyalgia, esophageal reflux. She presents for evaluation of dizziness. This started yesterday evening and has been persistent. She denies any significant aggravating or alleviating factors. She denies any headache or neck pain. She describes this as "feeling dopey", rather than a spinning or lightheaded sensation.  Patient is a 54 y.o. female presenting with dizziness. The history is provided by the patient.  Dizziness Quality:  Imbalance Severity:  Moderate Onset quality:  Sudden Duration:  12 hours Timing:  Constant Progression:  Worsening Chronicity:  New Relieved by:  Nothing Worsened by:  Nothing Ineffective treatments:  None tried Associated symptoms: no blood in stool, no diarrhea, no shortness of breath and no vision changes     Past Medical History  Diagnosis Date  . Depression   . GERD (gastroesophageal reflux disease)   . Hyperlipidemia   . Fibromyalgia   . Tobacco abuse   . Carpal tunnel syndrome   . Hypokalemia   . 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 Leg surgery  age 60    leg surgery with screws placed  . Hip fracture surgery  1995    left   Family History  Problem Relation Age of Onset  . Obesity Father   . Arthritis Father   . Heart disease Paternal Uncle     MI  . Diabetes Maternal Grandmother   . Heart disease Paternal Grandfather     MI  . Cancer Cousin     leukemia   Social History  Substance Use Topics  . Smoking status: Current Every Day Smoker -- 0.50 packs/day    Types: Cigarettes  . Smokeless tobacco: None  . Alcohol  Use: No   OB History    No data available     Review of Systems  Respiratory: Negative for shortness of breath.   Gastrointestinal: Negative for diarrhea and blood in stool.  Neurological: Positive for dizziness.  All other systems reviewed and are negative.     Allergies  Diclofenac sodium; Ibuprofen; Naproxen sodium; and Rofecoxib  Home Medications   Prior to Admission medications   Medication Sig Start Date End Date Taking? Authorizing Provider  cyclobenzaprine (FLEXERIL) 10 MG tablet TAKE 1 TABLET THREE TIMES A DAY AS NEEDED FOR MUSCLE SPASMS 05/14/15   Judy Pimple, MD  Multiple Vitamin (MULTIVITAMIN) tablet Take 1 tablet by mouth daily.      Historical Provider, MD  omeprazole (PRILOSEC) 20 MG capsule TAKE ONE CAPSULE BY MOUTH EVERY DAY Patient taking differently: Take 20 mg by mouth daily. TAKE ONE CAPSULE BY MOUTH EVERY DAY 12/04/14   Judy Pimple, MD  potassium chloride (MICRO-K) 10 MEQ CR capsule Take 2 capsules (20 mEq total) by mouth daily. 12/04/14   Judy Pimple, MD  sertraline (ZOLOFT) 100 MG tablet TAKE 1 AND 1/2 TABS ONCE DAILY Patient taking differently: Take 150 mg by mouth daily. TAKE 1 AND 1/2 TABS ONCE DAILY 12/04/14   Judy Pimple, MD  simvastatin (ZOCOR) 20 MG tablet Take 2 tablets (40 mg total) by mouth at bedtime. 12/04/14   Judy Pimple, MD  traMADol Janean Sark) 50  MG tablet TAKE 1 TABLET BY MOUTH 2 TIMES DAILY AS NEEDED 06/07/15   Judy PimpleMarne A Tower, MD   BP 126/73 mmHg  Pulse 77  Temp(Src) 98 F (36.7 C) (Oral)  Resp 14  Ht 5\' 10"  (1.778 m)  Wt 185 lb (83.915 kg)  BMI 26.54 kg/m2  SpO2 97% Physical Exam  Constitutional: She is oriented to person, place, and time. She appears well-developed and well-nourished. No distress.  HENT:  Head: Normocephalic and atraumatic.  Eyes: EOM are normal. Pupils are equal, round, and reactive to light.  Neck: Normal range of motion. Neck supple.  Cardiovascular: Normal rate and regular rhythm.  Exam reveals no gallop  and no friction rub.   No murmur heard. Pulmonary/Chest: Effort normal and breath sounds normal. No respiratory distress. She has no wheezes.  Abdominal: Soft. Bowel sounds are normal. She exhibits no distension. There is no tenderness.  Musculoskeletal: Normal range of motion.  Neurological: She is alert and oriented to person, place, and time. No cranial nerve deficit. She exhibits normal muscle tone. Coordination normal.  Skin: Skin is warm and dry. She is not diaphoretic.  Nursing note and vitals reviewed.   ED Course  Procedures (including critical care time) Labs Review Labs Reviewed  BASIC METABOLIC PANEL  CBC WITH DIFFERENTIAL/PLATELET    Imaging Review No results found. I have personally reviewed and evaluated these images and lab results as part of my medical decision-making.   EKG Interpretation   Date/Time:  Sunday August 22 2015 10:19:45 EDT Ventricular Rate:  75 PR Interval:  152 QRS Duration: 99 QT Interval:  409 QTC Calculation: 457 R Axis:   72 Text Interpretation:  Sinus rhythm Confirmed by Navreet Bolda  MD, Jalise Zawistowski ([id number])  on 08/22/2015 10:33:56 AM      MDM   Final diagnoses:  None    Patient is a 54 year old female who presents with dizziness. Her neurologic exam is nonfocal and she is feeling better after acquisition. Her laboratory studies are unremarkable and I see no indication for further workup at this time. She will be treated with meclizine and when necessary return.    Geoffery Lyonsouglas Koree Schopf, MD 08/22/15 1150

## 2015-08-22 NOTE — ED Notes (Signed)
Pt ambulated to bathroom with standby assistance. PT had slightly unsteady gait and complains of dizziness. PT returned from bathroom. Monitored by pulse ox, bp cuff, and 12-lead. Urine sample at bedside.

## 2015-08-22 NOTE — ED Notes (Signed)
Pt ambulated to restroom with no dizziness.  

## 2015-08-22 NOTE — Discharge Instructions (Signed)
Meclizine as prescribed.  Return to the emergency department if symptoms significantly worsen or change.   Dizziness Dizziness is a common problem. It is a feeling of unsteadiness or light-headedness. You may feel like you are about to faint. Dizziness can lead to injury if you stumble or fall. Anyone can become dizzy, but dizziness is more common in older adults. This condition can be caused by a number of things, including medicines, dehydration, or illness. HOME CARE INSTRUCTIONS Taking these steps may help with your condition: Eating and Drinking  Drink enough fluid to keep your urine clear or pale yellow. This helps to keep you from becoming dehydrated. Try to drink more clear fluids, such as water.  Do not drink alcohol.  Limit your caffeine intake if directed by your health care provider.  Limit your salt intake if directed by your health care provider. Activity  Avoid making quick movements.  Rise slowly from chairs and steady yourself until you feel okay.  In the morning, first sit up on the side of the bed. When you feel okay, stand slowly while you hold onto something until you know that your balance is fine.  Move your legs often if you need to stand in one place for a long time. Tighten and relax your muscles in your legs while you are standing.  Do not drive or operate heavy machinery if you feel dizzy.  Avoid bending down if you feel dizzy. Place items in your home so that they are easy for you to reach without leaning over. Lifestyle  Do not use any tobacco products, including cigarettes, chewing tobacco, or electronic cigarettes. If you need help quitting, ask your health care provider.  Try to reduce your stress level, such as with yoga or meditation. Talk with your health care provider if you need help. General Instructions  Watch your dizziness for any changes.  Take medicines only as directed by your health care provider. Talk with your health care  provider if you think that your dizziness is caused by a medicine that you are taking.  Tell a friend or a family member that you are feeling dizzy. If he or she notices any changes in your behavior, have this person call your health care provider.  Keep all follow-up visits as directed by your health care provider. This is important. SEEK MEDICAL CARE IF:  Your dizziness does not go away.  Your dizziness or light-headedness gets worse.  You feel nauseous.  You have reduced hearing.  You have new symptoms.  You are unsteady on your feet or you feel like the room is spinning. SEEK IMMEDIATE MEDICAL CARE IF:  You vomit or have diarrhea and are unable to eat or drink anything.  You have problems talking, walking, swallowing, or using your arms, hands, or legs.  You feel generally weak.  You are not thinking clearly or you have trouble forming sentences. It may take a friend or family member to notice this.  You have chest pain, abdominal pain, shortness of breath, or sweating.  Your vision changes.  You notice any bleeding.  You have a headache.  You have neck pain or a stiff neck.  You have a fever.   This information is not intended to replace advice given to you by your health care provider. Make sure you discuss any questions you have with your health care provider.   Document Released: 04/18/2001 Document Revised: 03/09/2015 Document Reviewed: 10/19/2014 Elsevier Interactive Patient Education [employer]2016 [employer]vier Inc.

## 2015-08-27 ENCOUNTER — Encounter: Payer: Self-pay | Admitting: Family Medicine

## 2015-08-27 ENCOUNTER — Ambulatory Visit (INDEPENDENT_AMBULATORY_CARE_PROVIDER_SITE_OTHER): Payer: Federal, State, Local not specified - PPO | Admitting: Family Medicine

## 2015-08-27 VITALS — BP 130/84 | HR 102 | Temp 98.5°F | Ht 68.75 in | Wt 178.5 lb

## 2015-08-27 DIAGNOSIS — Z23 Encounter for immunization: Secondary | ICD-10-CM | POA: Diagnosis not present

## 2015-08-27 DIAGNOSIS — H8113 Benign paroxysmal vertigo, bilateral: Secondary | ICD-10-CM | POA: Diagnosis not present

## 2015-08-27 DIAGNOSIS — H8110 Benign paroxysmal vertigo, unspecified ear: Secondary | ICD-10-CM | POA: Insufficient documentation

## 2015-08-27 MED ORDER — MECLIZINE HCL 25 MG PO TABS: 25.0000 mg | ORAL_TABLET | Freq: Three times a day (TID) | ORAL | Status: DC | PRN

## 2015-08-27 NOTE — Progress Notes (Signed)
Subjective:    Patient ID: Angela Petty, female    DOB: 03/29/1961, 54 y.o.   MRN: [id number]  HPI Here for f/u of ED visit on 10/16 for dizziness  Pt says it was almost like a panic attack -but she was more dizzy than anything else  Also symptoms worsened with movement (esp head)    Given meclizine - worked Adult nurse  She was having positional symptoms   Has been a little congested - allergies Drinks enough fluids     Results for orders placed or performed during the hospital encounter of 08/22/15  Basic metabolic panel  Result Value Ref Range   Sodium 135 135 - 145 mmol/L   Potassium 3.8 3.5 - 5.1 mmol/L   Chloride 100 (L) 101 - 111 mmol/L   CO2 27 22 - 32 mmol/L   Glucose, Bld 126 (H) 65 - 99 mg/dL   BUN <5 (L) 6 - 20 mg/dL   Creatinine, Ser 7.82 0.44 - 1.00 mg/dL   Calcium 8.8 (L) 8.9 - 10.3 mg/dL   GFR calc non Af Amer >60 >60 mL/min   GFR calc Af Amer >60 >60 mL/min   Anion gap 8 5 - 15  CBC with Differential  Result Value Ref Range   WBC 9.0 4.0 - 10.5 K/uL   RBC 4.41 3.87 - 5.11 MIL/uL   Hemoglobin 13.2 12.0 - 15.0 g/dL   HCT 95.6 21.3 - 08.6 %   MCV 88.7 78.0 - 100.0 fL   MCH 29.9 26.0 - 34.0 pg   MCHC 33.8 30.0 - 36.0 g/dL   RDW 57.8 46.9 - 62.9 %   Platelets 221 150 - 400 K/uL   Neutrophils Relative % 79 %   Neutro Abs 7.1 1.7 - 7.7 K/uL   Lymphocytes Relative 14 %   Lymphs Abs 1.2 0.7 - 4.0 K/uL   Monocytes Relative 5 %   Monocytes Absolute 0.4 0.1 - 1.0 K/uL   Eosinophils Relative 2 %   Eosinophils Absolute 0.2 0.0 - 0.7 K/uL   Basophils Relative 0 %   Basophils Absolute 0.0 0.0 - 0.1 K/uL      Review of Systems    Review of Systems  Constitutional: Negative for fever, appetite change, fatigue and unexpected weight change.  Eyes: Negative for pain and visual disturbance.  ENT pos for nasal congestion -intermittent  Respiratory: Negative for cough and shortness of breath.   Cardiovascular: Negative for cp or palpitations      Gastrointestinal: Negative for nausea, diarrhea and constipation.  Genitourinary: Negative for urgency and frequency.  Skin: Negative for pallor or rash   MSK pos for baseline aches and pains  Neurological: Negative for weakness, , numbness and headaches. pos for positional dizziness Hematological: Negative for adenopathy. Does not bruise/bleed easily.  Psychiatric/Behavioral: Negative for dysphoric mood. The patient is not nervous/anxious.      Objective:   Physical Exam  Constitutional: She is oriented to person, place, and time. She appears well-developed and well-nourished. No distress.  HENT:  Head: Normocephalic and atraumatic.  Right Ear: External ear normal.  Left Ear: External ear normal.  Nose: Nose normal.  Mouth/Throat: Oropharynx is clear and moist. No oropharyngeal exudate.  No sinus tenderness No temporal tenderness  No TMJ tenderness  Eyes: Conjunctivae and EOM are normal. Pupils are equal, round, and reactive to light. Right eye exhibits no discharge. Left eye exhibits no discharge. No scleral icterus.  2-3 beats of horizontal  nystagmus  Neck: Normal range  of motion and full passive range of motion without pain. Neck supple. No JVD present. Carotid bruit is not present. No tracheal deviation present. No thyromegaly present.  Cardiovascular: Normal rate, regular rhythm and normal heart sounds.   No murmur heard. Pulmonary/Chest: Effort normal and breath sounds normal. No respiratory distress. She has no wheezes. She has no rales.  Diffusely distant bs   Abdominal: Soft. Bowel sounds are normal. She exhibits no distension and no mass. There is no tenderness.  Musculoskeletal: She exhibits no edema or tenderness.  Lymphadenopathy:    She has no cervical adenopathy.  Neurological: She is alert and oriented to person, place, and time. She has normal strength and normal reflexes. She displays no atrophy and no tremor. No cranial nerve deficit or sensory deficit. She  exhibits normal muscle tone. She displays a negative Romberg sign. Coordination and gait normal.  No focal cerebellar signs   Skin: Skin is warm and dry. No rash noted. No pallor.  Psychiatric: She has a normal mood and affect. Her behavior is normal. Thought content normal.          Assessment & Plan:   Problem List Items Addressed This Visit      Nervous and Auditory   BPV (benign positional vertigo) - Primary    Rev ED records Improved but not quite resolved Reassuring exam Refill meclizine for prn use  Enc to change position slowly Enc use of steroid nasal spray for sinus congestion  Update if not starting to improve in a week or if worsening         Other Visit Diagnoses    Need for influenza vaccination        Relevant Orders    Flu Vaccine QUAD 36+ mos PF IM (Fluarix & Fluzone Quad PF) (Completed)

## 2015-08-27 NOTE — Progress Notes (Signed)
Pre visit review using our clinic review tool, if applicable. No additional management support is needed unless otherwise documented below in the visit note. 

## 2015-08-27 NOTE — Patient Instructions (Signed)
I think you have positional vertigo  Change position slowly Use meclizine when needed  For congestion - flonase nasal spray over the counter may help during allergy season    Update if not starting to improve in a week or if worsening    Flu shot today

## 2015-08-29 NOTE — Assessment & Plan Note (Signed)
Rev ED records Improved but not quite resolved Reassuring exam Refill meclizine for prn use  Enc to change position slowly Enc use of steroid nasal spray for sinus congestion  Update if not starting to improve in a week or if worsening

## 2015-10-24 ENCOUNTER — Other Ambulatory Visit: Payer: Self-pay | Admitting: Family Medicine

## 2015-10-25 ENCOUNTER — Other Ambulatory Visit: Payer: Self-pay | Admitting: Family Medicine

## 2015-10-28 ENCOUNTER — Telehealth: Payer: Self-pay | Admitting: Family Medicine

## 2015-10-29 ENCOUNTER — Other Ambulatory Visit: Payer: Self-pay

## 2015-10-29 MED ORDER — CYCLOBENZAPRINE HCL 10 MG PO TABS: ORAL_TABLET | ORAL | Status: DC

## 2015-10-29 NOTE — Telephone Encounter (Signed)
Will refill electronically Please let her know  

## 2015-10-29 NOTE — Telephone Encounter (Signed)
Patient called to get the status of her prescription.  Please call patient back at [phone].

## 2015-10-29 NOTE — Telephone Encounter (Signed)
Pt notified Rx was sent to pharmacy. 

## 2015-10-29 NOTE — Telephone Encounter (Signed)
Pt called to see why cyclobenzaprine refill was refused. Pt said she is not taking more than 3 tabs a day. Spoke with Efraim KaufmannShanie at [facility] and pt got med on 05/14/15; 06/11/15; 07/07/15; 08/04/15; 08/31/15 and 09/28/15. Pt is out of med and request refill to be done.Please advise. Pt last seen 09/28/15.

## 2015-10-29 NOTE — Telephone Encounter (Signed)
Addressed through another phone note 

## 2015-12-10 ENCOUNTER — Other Ambulatory Visit: Payer: Self-pay | Admitting: Family Medicine

## 2015-12-10 NOTE — Telephone Encounter (Signed)
Electronic refill request, last refilled on 06/07/15 #60 with 5 additional refills, pt had OV on 08/27/15, please advise

## 2015-12-11 NOTE — Telephone Encounter (Signed)
Px written for call in   

## 2015-12-13 NOTE — Telephone Encounter (Signed)
Rx called in as prescribed 

## 2015-12-23 ENCOUNTER — Other Ambulatory Visit: Payer: Self-pay | Admitting: Family Medicine

## 2016-01-12 ENCOUNTER — Other Ambulatory Visit: Payer: Self-pay | Admitting: Family Medicine

## 2016-01-21 ENCOUNTER — Other Ambulatory Visit: Payer: Self-pay | Admitting: Family Medicine

## 2016-02-17 ENCOUNTER — Telehealth: Payer: Self-pay | Admitting: *Deleted

## 2016-02-17 ENCOUNTER — Ambulatory Visit (INDEPENDENT_AMBULATORY_CARE_PROVIDER_SITE_OTHER): Payer: Federal, State, Local not specified - PPO | Admitting: Family Medicine

## 2016-02-17 ENCOUNTER — Encounter: Payer: Self-pay | Admitting: Family Medicine

## 2016-02-17 VITALS — BP 97/67 | HR 97 | Temp 98.5°F | Ht 68.75 in | Wt 176.2 lb

## 2016-02-17 DIAGNOSIS — M2550 Pain in unspecified joint: Secondary | ICD-10-CM

## 2016-02-17 DIAGNOSIS — R768 Other specified abnormal immunological findings in serum: Secondary | ICD-10-CM

## 2016-02-17 DIAGNOSIS — R7982 Elevated C-reactive protein (CRP): Secondary | ICD-10-CM

## 2016-02-17 DIAGNOSIS — M1812 Unilateral primary osteoarthritis of first carpometacarpal joint, left hand: Secondary | ICD-10-CM

## 2016-02-17 DIAGNOSIS — M19079 Primary osteoarthritis, unspecified ankle and foot: Secondary | ICD-10-CM

## 2016-02-17 DIAGNOSIS — M1990 Unspecified osteoarthritis, unspecified site: Secondary | ICD-10-CM | POA: Diagnosis not present

## 2016-02-17 DIAGNOSIS — M7501 Adhesive capsulitis of right shoulder: Secondary | ICD-10-CM | POA: Diagnosis not present

## 2016-02-17 LAB — HIGH SENSITIVITY CRP: CRP, High Sensitivity: 7.75 mg/L — ABNORMAL HIGH (ref 0.000–5.000)

## 2016-02-17 LAB — SEDIMENTATION RATE: SED RATE: 13 mm/h (ref 0–22)

## 2016-02-17 MED ORDER — METHYLPREDNISOLONE ACETATE 40 MG/ML IJ SUSP
80.0000 mg | Freq: Once | INTRAMUSCULAR | Status: AC
  Administered 2016-02-17: 80 mg via INTRA_ARTICULAR

## 2016-02-17 MED ORDER — DICLOFENAC SODIUM 3 % TD GEL: TRANSDERMAL | Status: DC

## 2016-02-17 NOTE — Progress Notes (Addendum)
Dr. Karleen Hampshire T. Yanelie Abraha, MD, CAQ Sports Medicine [facility] [address] [place], [address] Phone: [phone] Fax: [phone]  02/17/2016  Patient: Angela Petty, MRN: [id number], DOB: 26-May-1961, 55 y.o.  Primary Physician:  Roxy Manns, MD   Chief Complaint  Patient presents with  . Thumb Pain    Left  . Toe Pain    Right Big Toe  . Hip Pain    Right-radiates down to knee  . Shoulder Pain    Bilateral  . Knot on Back of Neck   Subjective:   Angela Petty is a 55 y.o. very pleasant female patient who presents with the following:  The patient presents with multiple musculoskeletal complaints.  She is accompanied by her partner.  Fibromyalgia, and has arthritis.   She has pain in the basal joint of the left thumb, and is been told previously that this may have been de Quervain's tenosynovitis.  She points to the base of her thumb at the [facility] joint, and she has some pain there for palpation as well as movement.  She has also had some small amount of wasting in the musculature there.  No trauma or injury.  Right-sided shoulder pain for 2 or 3 years.  She has pain in a T-shirt distribution and pain with the endpoint of terminal motion in all directions.  She is not clear about any kind of initial injury, but now it hurts all the time, it hurts at nighttime, and she has not done  Any type of specific intervention to make it improve other than not use it.  She also has some pain in the left MTP joint when she is walking.  This is on the first.  She has no significant bunion formation.  She also has various other complaints and is been told that she has fibromyalgia in the past.  She tells me that she saw a rheumatologist 15-20 years ago.  She has also had some type of operative repair of her left hip or pelvis with a fracture approximately 20 years ago but she can't tell me exactly which bones were injured.  Past Medical History, Surgical History,  Social History, Family History, Problem List, Medications, and Allergies have been reviewed and updated if relevant.  Patient Active Problem List   Diagnosis Date Noted  . BPV (benign positional vertigo) 08/27/2015  . De Quervain's tenosynovitis, left 12/04/2014  . Right shoulder pain 10/15/2013  . LEUKOCYTOSIS 11/01/2010  . HYPOKALEMIA 12/20/2009  . Hyperlipidemia 11/30/2009  . IBS 11/30/2009  . TOBACCO USE 08/26/2008  . DEPRESSION 08/26/2008  . Fibromyalgia 08/26/2008  . FATIGUE 08/26/2008    Past Medical History  Diagnosis Date  . Depression   . GERD (gastroesophageal reflux disease)   . Hyperlipidemia   . Fibromyalgia   . Tobacco abuse   . Carpal tunnel syndrome   . Hypokalemia   . Pyelonephritis 2006    hsopoitalized    Past Surgical History  Procedure Laterality Date  . Leg surgery  age 60    leg surgery with screws placed  . Hip fracture surgery  1995    left    Social History   Social History  . Marital Status: Single    Spouse Name: N/A  . Number of Children: N/A  . Years of Education: N/A   Occupational History  . Not on file.   Social History Main Topics  . Smoking status: Current Every Day Smoker -- 0.50  packs/day    Types: Cigarettes  . Smokeless tobacco: Never Used  . Alcohol Use: No  . Drug Use: No  . Sexual Activity: Not on file   Other Topics Concern  . Not on file   Social History Narrative    Family History  Problem Relation Age of Onset  . Obesity Father   . Arthritis Father   . Heart disease Paternal Uncle     MI  . Diabetes Maternal Grandmother   . Heart disease Paternal Grandfather     MI  . Cancer Cousin     leukemia    Allergies  Allergen Reactions  . Diclofenac Sodium     REACTION: GI  . Ibuprofen     REACTION: GI  . Naproxen Sodium     REACTION: GI, and did not work  . Rofecoxib     REACTION: GI    Medication list reviewed and updated in full in [facility].  GEN: No fevers, chills. Nontoxic.  Primarily MSK c/o today. MSK: Detailed in the HPI GI: tolerating PO intake without difficulty Neuro: as above Otherwise the pertinent positives of the ROS are noted above.   Objective:   BP 97/67 mmHg  Pulse 97  Temp(Src) 98.5 F (36.9 C) (Oral)  Ht 5' 8.75" (1.746 m)  Wt 176 lb 4 oz (79.946 kg)  BMI 26.22 kg/m2   GEN: WDWN, NAD, Non-toxic, Alert & Oriented x 3 HEENT: Atraumatic, Normocephalic.  Ears and Nose: No external deformity. EXTR: No clubbing/cyanosis/edema NEURO: Normal gait.  PSYCH: Normally interactive. Conversant. Not depressed or anxious appearing.  Calm demeanor.   Shoulder: R and L Inspection: No muscle wasting or winging Ecchymosis/edema: neg  AC joint, scapula, clavicle: NT Cervical spine: NT, full ROM Spurling's: neg ABNORMAL SIDE TESTED: R UNLESS OTHERWISE NOTED, THE CONTRALATERAL SIDE HAS FULL RANGE OF MOTION. Abduction: 5/5, LIMITED TO 140 DEGREES Flexion: 5/5, LIMITED TO 140 DEGNO ROM  IR, lift-off: 5/5. TESTED AT 90 DEGREES OF ABDUCTION, LIMITED TO 0 DEGREES ER at neutral:  5/5, TESTED AT 90 DEGREES OF ABDUCTION, LIMITED TO 75 DEGREES AC crossover and compression: PAIN Drop Test: neg Empty Can: neg Supraspinatus insertion: NT Bicipital groove: NT ALL OTHER SPECIAL TESTING EQUIVOCAL GIVEN LOSS OF MOTION C5-T1 intact Sensation intact Grip 5/5    L hand Ecchymosis or edema: neg ROM wrist/hand/digits: full  Carpals, MCP's, digits: 1st CMC joint notably tender to motion and compression Distal Ulna and Radius: NT Ecchymosis or edema: neg No instability Cysts/nodules: neg Digit triggering: neg Finkelstein's test: neg Snuffbox tenderness: neg Scaphoid tubercle: NT Resisted supination: NT Full composite fist, no malrotation Grip, all digits: 5/5 str DIPJT: NT PIP JT: NT MCP JT: NT No tenosynovitis Axial load test: neg Phalen's: neg Tinel's: neg Atrophy: minimal  Hand sensation: intact   Radiology: Results for orders placed or  performed in visit on 02/17/16  High sensitivity CRP  Result Value Ref Range   CRP, High Sensitivity 7.750 (H) 0.000 - 5.000 mg/L  Sedimentation rate  Result Value Ref Range   Sed Rate 13 0 - 22 mm/hr  ANA  Result Value Ref Range   Anit Nuclear Antibody(ANA) POS (A) NEGATIVE  Cyclic citrul peptide antibody, IgG  Result Value Ref Range   Cyclic Citrullin Peptide Ab <16 Units  Rheumatoid factor  Result Value Ref Range   Rhuematoid fact SerPl-aCnc <10 <=14 IU/mL  Anti-nuclear ab-titer (ANA titer)  Result Value Ref Range   ANA Pattern 1 DUAL PTN (A)  ANA Titer 1 SEE NOTE titer     Assessment and Plan:   Polyarthralgia - Plan: High sensitivity CRP, Sedimentation rate, ANA, Cyclic citrul peptide antibody, IgG, Rheumatoid factor  Frozen shoulder, right - Plan: methylPREDNISolone acetate (DEPO-MEDROL) injection 80 mg  Primary osteoarthritis of first carpometacarpal joint of left hand  Adhesive capsulitis of right shoulder  Arthritis of first metatarsophalangeal joint  Multiple joint complaints in a young woman.  I think it is reasonable to revisit the possibility of systemic rheumatological disease, so started basic workup today.  CRP 7.75 ANA POS with DUAL pattern, 1:320 nucleolar and 1:40 homogenous Lupus a possibility D/w patient, and I am going to have her see Rheumatology for further evaluation.  CCP AB and RF are neg.  Classic frozen shoulder - given Harvard protocol and intraarticular shoulder injection today.  Classic basal joint OA on the LEFT hand. I tried to give her some Voltaren gel, but this was denied by her insurance.  Also gave her a thumb spica splint that she can use at night sometimes to help when it is acting up.  Arthritis of the first MTP joint versus possibly capsulitis, little bit more challenging, and encouraged comfortable footwear.  Intrarticular Shoulder Injection, R Verbal consent was obtained from the patient. Risks including infection  explained and contrasted with benefits and alternatives. Patient prepped with Chloraprep and Ethyl Chloride used for anesthesia. An intraarticular shoulder injection was performed using the posterior approach. The patient tolerated the procedure well and had decreased pain post injection. No complications. Injection: 8 cc of Lidocaine 1% and 2 mL Depo-Medrol 40 mg. Needle: 22 gauge   New Prescriptions   DICLOFENAC SODIUM 3 % GEL    Apply 2 grams to small joints or 4 grams to knees   Orders Placed This Encounter  Procedures  . High sensitivity CRP  . Sedimentation rate  . ANA  . Cyclic citrul peptide antibody, IgG  . Rheumatoid factor  . Anti-nuclear ab-titer (ANA titer)    Signed,  Ferrel Simington T. Halden Phegley, MD   Patient's Medications  New Prescriptions   DICLOFENAC SODIUM 3 % GEL    Apply 2 grams to small joints or 4 grams to knees  Previous Medications   CYCLOBENZAPRINE (FLEXERIL) 10 MG TABLET    TAKE 1 TABLET THREE TIMES A DAY AS NEEDED FOR MUSCLE SPASMS   MECLIZINE (ANTIVERT) 25 MG TABLET    Take 1 tablet (25 mg total) by mouth 3 (three) times daily as needed for dizziness.   MULTIPLE VITAMIN (MULTIVITAMIN) TABLET    Take 1 tablet by mouth daily.     OMEPRAZOLE (PRILOSEC) 20 MG CAPSULE    TAKE ONE CAPSULE BY MOUTH EVERY DAY   POTASSIUM CHLORIDE (MICRO-K) 10 MEQ CR CAPSULE    TAKE 2 CAPSULES (20 MEQ TOTAL) BY MOUTH DAILY.   SERTRALINE (ZOLOFT) 100 MG TABLET    TAKE 1 AND 1/2 TABS ONCE DAILY   SIMVASTATIN (ZOCOR) 20 MG TABLET    TAKE 2 TABLETS (40 MG TOTAL) BY MOUTH AT BEDTIME.   TRAMADOL (ULTRAM) 50 MG TABLET    TAKE 1 TABLET TWICE A DAY AS NEEDED  Modified Medications   No medications on file  Discontinued Medications   No medications on file

## 2016-02-17 NOTE — Progress Notes (Signed)
Pre visit review using our clinic review tool, if applicable. No additional management support is needed unless otherwise documented below in the visit note. 

## 2016-02-17 NOTE — Telephone Encounter (Signed)
[facility] for Diclofenac Gel 3%.  PA completed phone and was denied.  [facility] notified.

## 2016-02-18 LAB — RHEUMATOID FACTOR: Rhuematoid fact SerPl-aCnc: <10 IU/mL (ref <=14)

## 2016-02-18 LAB — ANTI-NUCLEAR AB-TITER (ANA TITER)

## 2016-02-18 LAB — ANA: Anti Nuclear Antibody(ANA): POSITIVE — AB

## 2016-02-18 LAB — CYCLIC CITRUL PEPTIDE ANTIBODY, IGG

## 2016-02-22 DIAGNOSIS — M1812 Unilateral primary osteoarthritis of first carpometacarpal joint, left hand: Secondary | ICD-10-CM | POA: Insufficient documentation

## 2016-02-23 NOTE — Addendum Note (Signed)
Addended by: Hannah BeatOPLAND, Briany Aye on: 02/23/2016 10:24 AM   Modules accepted: Orders

## 2016-03-20 ENCOUNTER — Other Ambulatory Visit: Payer: Self-pay | Admitting: Family Medicine

## 2016-04-08 ENCOUNTER — Other Ambulatory Visit: Payer: Self-pay | Admitting: Family Medicine

## 2016-04-16 ENCOUNTER — Other Ambulatory Visit: Payer: Self-pay | Admitting: Family Medicine

## 2016-04-17 ENCOUNTER — Other Ambulatory Visit: Payer: Self-pay | Admitting: Family Medicine

## 2016-04-17 NOTE — Telephone Encounter (Signed)
Please schedule 30 min office visit for 6 mo and refill until then

## 2016-04-17 NOTE — Telephone Encounter (Signed)
Last filled on 10/29/15 #90 with 5 additional refills, please advise

## 2016-04-18 NOTE — Telephone Encounter (Signed)
appt scheduled and med refill 

## 2016-04-18 NOTE — Telephone Encounter (Signed)
Pt left v/m requesting status of cyclobenzaprine refill; pt is out of med.

## 2016-06-11 ENCOUNTER — Other Ambulatory Visit: Payer: Self-pay | Admitting: Family Medicine

## 2016-06-12 NOTE — Telephone Encounter (Signed)
Pt has f/u scheduled on 09/11/16, last filled on 12/11/15 #60 with 5 additional refills, please advise

## 2016-06-12 NOTE — Telephone Encounter (Signed)
Px written for call in   

## 2016-06-12 NOTE — Telephone Encounter (Signed)
[facility] 

## 2016-06-28 ENCOUNTER — Other Ambulatory Visit: Payer: Self-pay | Admitting: Family Medicine

## 2016-07-08 ENCOUNTER — Other Ambulatory Visit: Payer: Self-pay | Admitting: Family Medicine

## 2016-08-31 ENCOUNTER — Other Ambulatory Visit: Payer: Self-pay | Admitting: Family Medicine

## 2016-09-02 ENCOUNTER — Other Ambulatory Visit: Payer: Self-pay | Admitting: Family Medicine

## 2016-09-04 NOTE — Telephone Encounter (Signed)
Received refill electronically Last refill 04/18/16 #90/4 Last office visit 02/17/16/acute

## 2016-09-04 NOTE — Telephone Encounter (Signed)
Please refill times 3 

## 2016-09-04 NOTE — Telephone Encounter (Signed)
done

## 2016-09-11 ENCOUNTER — Encounter: Payer: Self-pay | Admitting: Family Medicine

## 2016-09-11 ENCOUNTER — Ambulatory Visit (INDEPENDENT_AMBULATORY_CARE_PROVIDER_SITE_OTHER): Payer: Federal, State, Local not specified - PPO | Admitting: Family Medicine

## 2016-09-11 VITALS — BP 102/68 | HR 104 | Temp 98.1°F | Ht 68.75 in | Wt 173.5 lb

## 2016-09-11 DIAGNOSIS — E7800 Pure hypercholesterolemia, unspecified: Secondary | ICD-10-CM

## 2016-09-11 DIAGNOSIS — Z23 Encounter for immunization: Secondary | ICD-10-CM | POA: Diagnosis not present

## 2016-09-11 DIAGNOSIS — F17200 Nicotine dependence, unspecified, uncomplicated: Secondary | ICD-10-CM

## 2016-09-11 DIAGNOSIS — M797 Fibromyalgia: Secondary | ICD-10-CM | POA: Diagnosis not present

## 2016-09-11 DIAGNOSIS — F339 Major depressive disorder, recurrent, unspecified: Secondary | ICD-10-CM

## 2016-09-11 LAB — COMPREHENSIVE METABOLIC PANEL
ALK PHOS: 68 U/L (ref 39–117)
ALT: 24 U/L (ref 0–35)
AST: 19 U/L (ref 0–37)
Albumin: 4.3 g/dL (ref 3.5–5.2)
BILIRUBIN TOTAL: 0.3 mg/dL (ref 0.2–1.2)
BUN: 13 mg/dL (ref 6–23)
CO2: 31 meq/L (ref 19–32)
Calcium: 9.5 mg/dL (ref 8.4–10.5)
Chloride: 104 mEq/L (ref 96–112)
Creatinine, Ser: 0.71 mg/dL (ref 0.40–1.20)
GFR: 91.35 mL/min (ref >60.00)
GLUCOSE: 107 mg/dL — AB (ref 70–99)
Potassium: 4.2 mEq/L (ref 3.5–5.1)
SODIUM: 140 meq/L (ref 135–145)
TOTAL PROTEIN: 6.9 g/dL (ref 6.0–8.3)

## 2016-09-11 LAB — LIPID PANEL
CHOL/HDL RATIO: 3
Cholesterol: 184 mg/dL (ref 0–200)
HDL: 54.5 mg/dL (ref >39.00)
LDL Cholesterol: 107 mg/dL — ABNORMAL HIGH (ref 0–99)
NONHDL: 129.88
Triglycerides: 114 mg/dL (ref 0.0–149.0)
VLDL: 22.8 mg/dL (ref 0.0–40.0)

## 2016-09-11 MED ORDER — OMEPRAZOLE 20 MG PO CPDR: 20.0000 mg | DELAYED_RELEASE_CAPSULE | Freq: Every day | ORAL | 3 refills | Status: DC

## 2016-09-11 MED ORDER — POTASSIUM CHLORIDE ER 10 MEQ PO CPCR: ORAL_CAPSULE | ORAL | 3 refills | Status: DC

## 2016-09-11 MED ORDER — MECLIZINE HCL 25 MG PO TABS: 25.0000 mg | ORAL_TABLET | Freq: Three times a day (TID) | ORAL | 0 refills | Status: DC | PRN

## 2016-09-11 MED ORDER — SERTRALINE HCL 100 MG PO TABS: ORAL_TABLET | ORAL | 3 refills | Status: DC

## 2016-09-11 NOTE — Assessment & Plan Note (Signed)
Pt remains on 1/2 ppd and has stayed there Disc in detail risks of smoking and possible outcomes including copd, vascular/ heart disease, cancer , respiratory and sinus infections  Pt voices understanding She is not ready to totally quit yet but plans to keep slowly cutting down

## 2016-09-11 NOTE — Assessment & Plan Note (Signed)
Mood is stable as long as she stays on the 100 mg of sertraline  Enc outdoor time and low impact exercise and socialization Reviewed stressors/ coping techniques/symptoms/ support sources/ tx options and side effects in detail today

## 2016-09-11 NOTE — Patient Instructions (Signed)
Take care of yourself  Try to stretch and walk (low impact exercise) for pain  Continue heat if it helps No change in medicine Flu shot today Labs today

## 2016-09-11 NOTE — Progress Notes (Signed)
Subjective:    Patient ID: Angela Petty, female    DOB: 02/18/1961, 55 y.o.   MRN: [id number]  HPI  Here for f/u of chronic medical problems  Wt Readings from Last 3 Encounters:  09/11/16 173 lb 8 oz (78.7 kg)  02/17/16 176 lb 4 oz (79.9 kg)  08/27/15 178 lb 8 oz (81 kg)   bmi is 25.8 She eats fairly well (craves sweets unfortunately)   Had her flu shot here today  Hx of hyperlipidemia Lab Results  Component Value Date   CHOL 192 12/18/2014   HDL 48.90 12/18/2014   LDLCALC 112 (H) 12/18/2014   LDLDIRECT 130.8 11/11/2013   TRIG 154.0 (H) 12/18/2014   CHOLHDL 4 12/18/2014   On simvastatin and diet (eating more sweets) Due for labs   Smoker Smoking status- about the same 1/2 ppd  No plans to quit  Some smokers cough - occ phlegm   Takes sertraline for chronic depression  Mood has been pretty good   Hx of fibromyalgia along with chronic fatigue Uses tramadol  Flexeril for spasm (a lot more back spasms lately) - flexeril helps some / she uses a heating pad  No real change in chronic pain  Activity- she chasing a new puppy   More hot natured -hot flashes   Gets occ vertigo (every other month)- likes to have meclizine on hand   Patient Active Problem List   Diagnosis Date Noted  . Primary osteoarthritis of first carpometacarpal joint of left hand 02/22/2016  . BPV (benign positional vertigo) 08/27/2015  . HYPOKALEMIA 12/20/2009  . Hyperlipidemia 11/30/2009  . IBS 11/30/2009  . TOBACCO USE 08/26/2008  . Depression 08/26/2008  . Fibromyalgia 08/26/2008  . FATIGUE 08/26/2008   Past Medical History:  Diagnosis Date  . Carpal tunnel syndrome   . Depression   . Fibromyalgia   . GERD (gastroesophageal reflux disease)   . Hyperlipidemia   . Hypokalemia   . 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 Tobacco abuse    Past Surgical History:  Procedure Laterality Date  . HIP FRACTURE SURGERY  1995   left  . LEG SURGERY  age 55   leg surgery with screws placed     Social History  Substance Use Topics  . Smoking status: Current Every Day Smoker    Packs/day: 0.50    Types: Cigarettes  . Smokeless tobacco: Never Used  . Alcohol use No   Family History  Problem Relation Age of Onset  . Obesity Father   . Arthritis Father   . Heart disease Paternal Uncle     MI  . Diabetes Maternal Grandmother   . Heart disease Paternal Grandfather     MI  . Cancer Cousin     leukemia   Allergies  Allergen Reactions  . Diclofenac Sodium     REACTION: GI  . Ibuprofen     REACTION: GI  . Naproxen Sodium     REACTION: GI, and did not work  . Rofecoxib     REACTION: GI   Current Outpatient Prescriptions on File Prior to Visit  Medication Sig Dispense Refill  . cyclobenzaprine (FLEXERIL) 10 MG tablet TAKE 1 TABLET BY MOUTH 3 TIMES A DAY AS NEEDED FOR MUSCLE SPASM 90 tablet 3  . Multiple Vitamin (MULTIVITAMIN) tablet Take 1 tablet by mouth daily.      . simvastatin (ZOCOR) 20 MG tablet TAKE 2 TABLETS (40 MG TOTAL) BY MOUTH AT BEDTIME. 180 tablet 0  .  traMADol (ULTRAM) 50 MG tablet TAKE 1 TABLET TWICE A DAY AS NEEDED 60 tablet 5   No current facility-administered medications on file prior to visit.      Review of Systems    Review of Systems  Constitutional: Negative for fever, appetite change, fatigue and unexpected weight change.  Eyes: Negative for pain and visual disturbance.  Respiratory: Negative for cough and shortness of breath.   Cardiovascular: Negative for cp or palpitations    Gastrointestinal: Negative for nausea, diarrhea and constipation.  Genitourinary: Negative for urgency and frequency.  Skin: Negative for pallor or rash   MSK pos for myofascial pain and back muscle spasms  Neurological: Negative for weakness, light-headedness, numbness and headaches.  Hematological: Negative for adenopathy. Does not bruise/bleed easily.  Psychiatric/Behavioral: Negative for dysphoric mood.(on sertraline) neg for SI The patient is not  nervous/anxious.      Objective:   Physical Exam  Constitutional: She appears well-developed and well-nourished. No distress.  Well appearing    HENT:  Head: Normocephalic and atraumatic.  Mouth/Throat: Oropharynx is clear and moist.  Eyes: Conjunctivae and EOM are normal. Pupils are equal, round, and reactive to light.  Neck: Normal range of motion. Neck supple. No JVD present. Carotid bruit is not present. No thyromegaly present.  Cardiovascular: Normal rate, regular rhythm, normal heart sounds and intact distal pulses.  Exam reveals no gallop.   Pulmonary/Chest: Effort normal and breath sounds normal. No respiratory distress. She has no wheezes. She has no rales.  No crackles  Abdominal: Soft. Bowel sounds are normal. She exhibits no distension, no abdominal bruit and no mass. There is no tenderness.  Musculoskeletal: She exhibits no edema.  No joint swelling   Sensitive myofascial areas/ with trigger points   Nl rom of LS today and nl gait  Lymphadenopathy:    She has no cervical adenopathy.  Neurological: She is alert. She has normal reflexes. She displays no tremor. No cranial nerve deficit. She exhibits normal muscle tone. Coordination normal.  Skin: Skin is warm and dry. No rash noted. No pallor.  Psychiatric: She has a normal mood and affect. Her speech is normal and behavior is normal. Thought content normal. Her mood appears not anxious. Her affect is not blunt, not labile and not inappropriate. She does not exhibit a depressed mood.  Pleasant           Assessment & Plan:   Problem List Items Addressed This Visit      Other   Depression    Mood is stable as long as she stays on the 100 mg of sertraline  Enc outdoor time and low impact exercise and socialization Reviewed stressors/ coping techniques/symptoms/ support sources/ tx options and side effects in detail today       Relevant Medications   sertraline (ZOLOFT) 100 MG tablet   Fibromyalgia - Primary     Overall stable with more back spasms Enc to continue heat  Also try to engage in more stretching and low impact exercise as tolerated  Will continue to refill flexeril and tramadol as needed        Hyperlipidemia    Disc goals for lipids and reasons to control them Rev labs with pt (last check) Rev low sat fat diet in detail  Diet is fair On simvastatin  Lab today for lipid panel      Relevant Orders   Lipid panel   Comprehensive metabolic panel   TOBACCO USE    Pt remains on 1/2  ppd and has stayed there Disc in detail risks of smoking and possible outcomes including copd, vascular/ heart disease, cancer , respiratory and sinus infections  Pt voices understanding She is not ready to totally quit yet but plans to keep slowly cutting down       Other Visit Diagnoses    Need for influenza vaccination       Relevant Orders   Flu Vaccine QUAD 36+ mos IM (Completed)

## 2016-09-11 NOTE — Assessment & Plan Note (Signed)
Overall stable with more back spasms Enc to continue heat  Also try to engage in more stretching and low impact exercise as tolerated  Will continue to refill flexeril and tramadol as needed

## 2016-09-11 NOTE — Assessment & Plan Note (Signed)
Disc goals for lipids and reasons to control them Rev labs with pt (last check) Rev low sat fat diet in detail  Diet is fair On simvastatin  Lab today for lipid panel

## 2016-09-11 NOTE — Progress Notes (Signed)
Pre visit review using our clinic review tool, if applicable. No additional management support is needed unless otherwise documented below in the visit note. 

## 2016-09-25 ENCOUNTER — Other Ambulatory Visit: Payer: Self-pay | Admitting: Family Medicine

## 2016-10-08 ENCOUNTER — Other Ambulatory Visit: Payer: Self-pay | Admitting: Family Medicine

## 2016-11-08 DIAGNOSIS — J209 Acute bronchitis, unspecified: Secondary | ICD-10-CM | POA: Diagnosis not present

## 2016-12-25 ENCOUNTER — Other Ambulatory Visit: Payer: Self-pay | Admitting: Family Medicine

## 2016-12-25 NOTE — Telephone Encounter (Signed)
Pt had f/u appt on 09/11/16, last filled on 09/04/16 #90 tabs with 3 additional refills, please advise

## 2016-12-25 NOTE — Telephone Encounter (Signed)
Please refill for 6 months 

## 2016-12-25 NOTE — Telephone Encounter (Signed)
done

## 2016-12-26 ENCOUNTER — Other Ambulatory Visit: Payer: Self-pay | Admitting: Family Medicine

## 2016-12-26 NOTE — Telephone Encounter (Signed)
Rx called in as prescribed 

## 2016-12-26 NOTE — Telephone Encounter (Signed)
Pt had f/u on 09/11/16, last filled on 06/12/16 #60 tabs with 5 additional refills, please advise

## 2016-12-26 NOTE — Telephone Encounter (Signed)
Px written for call in   

## 2017-03-04 ENCOUNTER — Telehealth: Payer: Self-pay | Admitting: Family Medicine

## 2017-03-04 DIAGNOSIS — R7309 Other abnormal glucose: Secondary | ICD-10-CM | POA: Insufficient documentation

## 2017-03-04 DIAGNOSIS — R7303 Prediabetes: Secondary | ICD-10-CM | POA: Insufficient documentation

## 2017-03-04 DIAGNOSIS — E7800 Pure hypercholesterolemia, unspecified: Secondary | ICD-10-CM

## 2017-03-04 NOTE — Telephone Encounter (Signed)
-----   Message from Alvina Chou sent at 02/28/2017 11:34 AM EDT ----- Regarding: Lab orders for Monday, 5.7.18 Lab orders, no f/u appt

## 2017-03-12 ENCOUNTER — Other Ambulatory Visit (INDEPENDENT_AMBULATORY_CARE_PROVIDER_SITE_OTHER): Payer: Federal, State, Local not specified - PPO

## 2017-03-12 DIAGNOSIS — R7309 Other abnormal glucose: Secondary | ICD-10-CM | POA: Diagnosis not present

## 2017-03-12 DIAGNOSIS — E7800 Pure hypercholesterolemia, unspecified: Secondary | ICD-10-CM

## 2017-03-12 LAB — COMPREHENSIVE METABOLIC PANEL
ALT: 26 U/L (ref 0–35)
AST: 22 U/L (ref 0–37)
Albumin: 4.5 g/dL (ref 3.5–5.2)
Alkaline Phosphatase: 65 U/L (ref 39–117)
BILIRUBIN TOTAL: 0.3 mg/dL (ref 0.2–1.2)
BUN: 14 mg/dL (ref 6–23)
CO2: 31 meq/L (ref 19–32)
Calcium: 9.5 mg/dL (ref 8.4–10.5)
Chloride: 103 mEq/L (ref 96–112)
Creatinine, Ser: 0.7 mg/dL (ref 0.40–1.20)
GFR: 92.68 mL/min (ref >60.00)
GLUCOSE: 116 mg/dL — AB (ref 70–99)
Potassium: 4.5 mEq/L (ref 3.5–5.1)
SODIUM: 140 meq/L (ref 135–145)
Total Protein: 7.1 g/dL (ref 6.0–8.3)

## 2017-03-12 LAB — LIPID PANEL
Cholesterol: 190 mg/dL (ref 0–200)
HDL: 52.8 mg/dL (ref >39.00)
LDL CALC: 112 mg/dL — AB (ref 0–99)
NONHDL: 136.89
TRIGLYCERIDES: 126 mg/dL (ref 0.0–149.0)
Total CHOL/HDL Ratio: 4
VLDL: 25.2 mg/dL (ref 0.0–40.0)

## 2017-03-12 LAB — HEMOGLOBIN A1C: Hgb A1c MFr Bld: 5.7 % (ref 4.6–6.5)

## 2017-03-13 ENCOUNTER — Encounter: Payer: Self-pay | Admitting: *Deleted

## 2017-06-18 ENCOUNTER — Other Ambulatory Visit: Payer: Self-pay | Admitting: Family Medicine

## 2017-06-18 NOTE — Telephone Encounter (Signed)
I think this request is about 5 days early  Call back later in the week

## 2017-06-18 NOTE — Telephone Encounter (Signed)
Patient left a voicemail wanting to know why her Cyclobenzaprine was denied? Patient requested a call back.

## 2017-06-18 NOTE — Telephone Encounter (Signed)
Per DPR left voicemail letting pt know Rx was requested to early and to send a new refill request closer to refill date

## 2017-06-18 NOTE — Telephone Encounter (Signed)
Last OV was 09/11/16 and no future appts., last refilled on 12/25/16 #90 tabs with 5 additional refills, please advise

## 2017-06-24 ENCOUNTER — Other Ambulatory Visit: Payer: Self-pay | Admitting: Family Medicine

## 2017-06-25 NOTE — Telephone Encounter (Signed)
Last OV was 09/11/16 and no future appts., last filled on 12/25/16 #90 tabs with 5 additional refills, please advise

## 2017-06-25 NOTE — Telephone Encounter (Signed)
Please schedule a December f/u and refill until then

## 2017-06-26 NOTE — Telephone Encounter (Signed)
appt scheduled and med refilled 

## 2017-06-30 ENCOUNTER — Other Ambulatory Visit: Payer: Self-pay | Admitting: Family Medicine

## 2017-07-02 NOTE — Telephone Encounter (Signed)
Please refill until next visit  

## 2017-07-02 NOTE — Telephone Encounter (Signed)
Rx called in as prescribed 

## 2017-07-02 NOTE — Telephone Encounter (Signed)
Oops- sorry about that  Px written for call in

## 2017-07-02 NOTE — Telephone Encounter (Signed)
Med refill appt scheduled for 10/08/17, last filled on 12/26/16 #60 tabs with 5 additional refills, please advise

## 2017-07-02 NOTE — Telephone Encounter (Signed)
Since it's a controlled sub, you would have to approve

## 2017-07-04 ENCOUNTER — Other Ambulatory Visit: Payer: Self-pay | Admitting: Family Medicine

## 2017-09-18 ENCOUNTER — Other Ambulatory Visit: Payer: Self-pay | Admitting: Family Medicine

## 2017-09-24 ENCOUNTER — Other Ambulatory Visit: Payer: Self-pay | Admitting: Family Medicine

## 2017-10-08 ENCOUNTER — Encounter: Payer: Self-pay | Admitting: Family Medicine

## 2017-10-08 ENCOUNTER — Ambulatory Visit: Payer: Federal, State, Local not specified - PPO | Admitting: Family Medicine

## 2017-10-08 VITALS — BP 120/64 | HR 98 | Temp 98.1°F | Ht 68.75 in | Wt 172.5 lb

## 2017-10-08 DIAGNOSIS — R7309 Other abnormal glucose: Secondary | ICD-10-CM

## 2017-10-08 DIAGNOSIS — F17200 Nicotine dependence, unspecified, uncomplicated: Secondary | ICD-10-CM

## 2017-10-08 DIAGNOSIS — E876 Hypokalemia: Secondary | ICD-10-CM | POA: Diagnosis not present

## 2017-10-08 DIAGNOSIS — E7800 Pure hypercholesterolemia, unspecified: Secondary | ICD-10-CM | POA: Diagnosis not present

## 2017-10-08 DIAGNOSIS — R5382 Chronic fatigue, unspecified: Secondary | ICD-10-CM | POA: Diagnosis not present

## 2017-10-08 DIAGNOSIS — F339 Major depressive disorder, recurrent, unspecified: Secondary | ICD-10-CM

## 2017-10-08 DIAGNOSIS — M797 Fibromyalgia: Secondary | ICD-10-CM | POA: Diagnosis not present

## 2017-10-08 DIAGNOSIS — Z23 Encounter for immunization: Secondary | ICD-10-CM | POA: Diagnosis not present

## 2017-10-08 LAB — COMPREHENSIVE METABOLIC PANEL
ALT: 21 U/L (ref 0–35)
AST: 21 U/L (ref 0–37)
Albumin: 4.5 g/dL (ref 3.5–5.2)
Alkaline Phosphatase: 69 U/L (ref 39–117)
BILIRUBIN TOTAL: 0.4 mg/dL (ref 0.2–1.2)
BUN: 10 mg/dL (ref 6–23)
CO2: 30 meq/L (ref 19–32)
CREATININE: 0.64 mg/dL (ref 0.40–1.20)
Calcium: 9.6 mg/dL (ref 8.4–10.5)
Chloride: 103 mEq/L (ref 96–112)
GFR: 102.56 mL/min (ref >60.00)
GLUCOSE: 117 mg/dL — AB (ref 70–99)
Potassium: 4.2 mEq/L (ref 3.5–5.1)
Sodium: 140 mEq/L (ref 135–145)
TOTAL PROTEIN: 6.9 g/dL (ref 6.0–8.3)

## 2017-10-08 LAB — CBC WITH DIFFERENTIAL/PLATELET
BASOS ABS: 0 10*3/uL (ref 0.0–0.1)
Basophils Relative: 0.4 % (ref 0.0–3.0)
EOS ABS: 0.3 10*3/uL (ref 0.0–0.7)
Eosinophils Relative: 5.1 % — ABNORMAL HIGH (ref 0.0–5.0)
HEMATOCRIT: 39.8 % (ref 36.0–46.0)
Hemoglobin: 13.6 g/dL (ref 12.0–15.0)
LYMPHS ABS: 2 10*3/uL (ref 0.7–4.0)
LYMPHS PCT: 29.7 % (ref 12.0–46.0)
MCHC: 34.1 g/dL (ref 30.0–36.0)
MCV: 91.4 fl (ref 78.0–100.0)
Monocytes Absolute: 0.5 10*3/uL (ref 0.1–1.0)
Monocytes Relative: 7.8 % (ref 3.0–12.0)
NEUTROS ABS: 3.7 10*3/uL (ref 1.4–7.7)
NEUTROS PCT: 57 % (ref 43.0–77.0)
PLATELETS: 239 10*3/uL (ref 150.0–400.0)
RBC: 4.35 Mil/uL (ref 3.87–5.11)
RDW: 12.9 % (ref 11.5–15.5)
WBC: 6.6 10*3/uL (ref 4.0–10.5)

## 2017-10-08 LAB — LIPID PANEL
CHOL/HDL RATIO: 4
Cholesterol: 196 mg/dL (ref 0–200)
HDL: 48.2 mg/dL (ref >39.00)
LDL CALC: 118 mg/dL — AB (ref 0–99)
NONHDL: 147.82
Triglycerides: 151 mg/dL — ABNORMAL HIGH (ref 0.0–149.0)
VLDL: 30.2 mg/dL (ref 0.0–40.0)

## 2017-10-08 LAB — HEMOGLOBIN A1C: Hgb A1c MFr Bld: 5.7 % (ref 4.6–6.5)

## 2017-10-08 LAB — TSH: TSH: 2.11 u[IU]/mL (ref 0.35–4.50)

## 2017-10-08 MED ORDER — OMEPRAZOLE 20 MG PO CPDR: 20.0000 mg | DELAYED_RELEASE_CAPSULE | Freq: Every day | ORAL | 3 refills | Status: DC

## 2017-10-08 MED ORDER — CYCLOBENZAPRINE HCL 10 MG PO TABS: ORAL_TABLET | ORAL | 3 refills | Status: DC

## 2017-10-08 MED ORDER — SERTRALINE HCL 100 MG PO TABS: ORAL_TABLET | ORAL | 3 refills | Status: DC

## 2017-10-08 MED ORDER — POTASSIUM CHLORIDE ER 10 MEQ PO CPCR: ORAL_CAPSULE | ORAL | 3 refills | Status: DC

## 2017-10-08 NOTE — Assessment & Plan Note (Signed)
Stable per pt with tramadol/flexeril and sertraline  She walks more now with 2 dogs which is helpful  No trigger points today  Symptoms vary with weather

## 2017-10-08 NOTE — Assessment & Plan Note (Signed)
Very stable with sertraline  Doing better for exercise/outdoor time  Reviewed stressors/ coping techniques/symptoms/ support sources/ tx options and side effects in detail today Not a lot of stressors

## 2017-10-08 NOTE — Patient Instructions (Addendum)
Try to work on diet - for cholesterol and also sugar  Try to get most of your carbohydrates from produce (with the exception of white potatoes)  Eat less bread/pasta/rice/snack foods/cereals/sweets and other items from the middle of the grocery store (processed carbs)  Less red meat and bacon if you can for cholesterol   Flu shot today   Labs today

## 2017-10-08 NOTE — Progress Notes (Signed)
Subjective:    Patient ID: Angela Petty, female    DOB: 02/11/1961, 56 y.o.   MRN: [id number]  HPI  Here for f/u of chronic health problems Doing ok overall -nothing new going on   Wt Readings from Last 3 Encounters:  10/08/17 172 lb 8 oz (78.2 kg)  09/11/16 173 lb 8 oz (78.7 kg)  02/17/16 176 lb 4 oz (79.9 kg)  wt is stable/down a bit from 2017/not a big eater (quit drinking 8 cokes per day)  25.66 kg/m  Depression  Has been on sertraline chronically-also helps with fibromyalgia and sleep Has been stable but tired  For exercise - she can do some walking with her dogs  (2 dogs) This helps   H/o elevated glucose  Watching for DM Lab Results  Component Value Date   HGBA1C 5.7 03/12/2017  she quit cokes for the most part  Diet- still has a lot of sweets and carbs   Fibromyalgia with chronic fatigue  About the same  Takes tramadol prn for pain - takes it every day  Flexeril for muscle spasm and tightness- still helps a lot  Sleep is fair /not great (dog keeps her up and she is a night owl)    Hyperlipidemia Lab Results  Component Value Date   CHOL 190 03/12/2017   HDL 52.80 03/12/2017   LDLCALC 112 (H) 03/12/2017   LDLDIRECT 130.8 11/11/2013   TRIG 126.0 03/12/2017   CHOLHDL 4 03/12/2017   On simvastatin and diet  Does eat bacon  Not as much fried foods  Some red meat  Some butter   Smoker 1/2 ppd Still the same  No intention to quit currently  A little hoarse with smokers cough occasionally    Needs flu shot today  Declines all other health mt  Patient Active Problem List   Diagnosis Date Noted  . Elevated glucose 03/04/2017  . Primary osteoarthritis of first carpometacarpal joint of left hand 02/22/2016  . BPV (benign positional vertigo) 08/27/2015  . HYPOKALEMIA 12/20/2009  . Hyperlipidemia 11/30/2009  . IBS 11/30/2009  . TOBACCO USE 08/26/2008  . Depression 08/26/2008  . Fibromyalgia 08/26/2008  . Chronic fatigue 08/26/2008   Past Medical  History:  Diagnosis Date  . Carpal tunnel syndrome   . Depression   . Fibromyalgia   . GERD (gastroesophageal reflux disease)   . Hyperlipidemia   . Hypokalemia   . 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 Tobacco abuse    Past Surgical History:  Procedure Laterality Date  . HIP FRACTURE SURGERY  1995   left  . LEG SURGERY  age 89   leg surgery with screws placed   Social History   Tobacco Use  . Smoking status: Current Every Day Smoker    Packs/day: 0.50    Types: Cigarettes  . Smokeless tobacco: Never Used  Substance Use Topics  . Alcohol use: No    Alcohol/week: 0.0 oz  . Drug use: Yes    Types: Marijuana    Comment: said uses it for pain   Family History  Problem Relation Age of Onset  . Obesity Father   . Arthritis Father   . Heart disease Paternal Uncle        MI  . Diabetes Maternal Grandmother   . Heart disease Paternal Grandfather        MI  . Cancer Cousin        leukemia   Allergies  Allergen Reactions  . Diclofenac Sodium  REACTION: GI  . Ibuprofen     REACTION: GI  . Naproxen Sodium     REACTION: GI, and did not work  . Rofecoxib     REACTION: GI   Current Outpatient Medications on File Prior to Visit  Medication Sig Dispense Refill  . meclizine (ANTIVERT) 25 MG tablet Take 1 tablet (25 mg total) by mouth 3 (three) times daily as needed for dizziness. 30 tablet 0  . Multiple Vitamin (MULTIVITAMIN) tablet Take 1 tablet by mouth daily.      . simvastatin (ZOCOR) 20 MG tablet TAKE 2 TABLETS (40 MG TOTAL) BY MOUTH AT BEDTIME. 180 tablet 2  . traMADol (ULTRAM) 50 MG tablet TAKE 1 TABLET TWICE DAILY AS NEEDED FOR PAIN 60 tablet 3   No current facility-administered medications on file prior to visit.      Review of Systems  Constitutional: Positive for fatigue. Negative for activity change, appetite change, fever and unexpected weight change.  HENT: Negative for congestion, ear pain, rhinorrhea, sinus pressure and sore throat.   Eyes:  Negative for pain, redness and visual disturbance.  Respiratory: Negative for cough, shortness of breath and wheezing.   Cardiovascular: Negative for chest pain and palpitations.  Gastrointestinal: Negative for abdominal pain, blood in stool, constipation and diarrhea.  Endocrine: Negative for polydipsia and polyuria.  Genitourinary: Negative for dysuria, frequency and urgency.  Musculoskeletal: Positive for arthralgias, back pain and myalgias.  Skin: Negative for pallor and rash.  Allergic/Immunologic: Negative for environmental allergies.  Neurological: Negative for dizziness, syncope and headaches.  Hematological: Negative for adenopathy. Does not bruise/bleed easily.  Psychiatric/Behavioral: Negative for decreased concentration, dysphoric mood and suicidal ideas. The patient is not nervous/anxious.        Objective:   Physical Exam  Constitutional: She appears well-developed and well-nourished. No distress.  Well appearing   HENT:  Head: Normocephalic and atraumatic.  Mouth/Throat: Oropharynx is clear and moist.  Eyes: Conjunctivae and EOM are normal. Pupils are equal, round, and reactive to light.  Neck: Normal range of motion. Neck supple. No JVD present. Carotid bruit is not present. No thyromegaly present.  Cardiovascular: Normal rate, regular rhythm, normal heart sounds and intact distal pulses. Exam reveals no gallop.  Pulmonary/Chest: Effort normal and breath sounds normal. No respiratory distress. She has no wheezes. She has no rales.  No crackles  Mildly distant bs Good air exch No wheeze or cough  Abdominal: Soft. Bowel sounds are normal. She exhibits no distension, no abdominal bruit and no mass. There is no tenderness.  Musculoskeletal: She exhibits no edema or tenderness.  Lymphadenopathy:    She has no cervical adenopathy.  Neurological: She is alert. She has normal reflexes. She displays no tremor. No cranial nerve deficit. She exhibits normal muscle tone.  Coordination normal.  Skin: Skin is warm and dry. No rash noted. No pallor.  Psychiatric: She has a normal mood and affect. Her speech is normal and behavior is normal. Her mood appears not anxious. Her affect is not blunt and not labile. She does not exhibit a depressed mood.  Cheerful today          Assessment & Plan:   Problem List Items Addressed This Visit      Other   Chronic fatigue    This trends with her fibromyalgia and depression  Fairly stable  Lab today incl cbc and tsh  Enc exercise and better diet /more balanced        Relevant Orders   CBC with  Differential/Platelet   TSH   Depression - Primary    Very stable with sertraline  Doing better for exercise/outdoor time  Reviewed stressors/ coping techniques/symptoms/ support sources/ tx options and side effects in detail today Not a lot of stressors       Relevant Medications   sertraline (ZOLOFT) 100 MG tablet   Elevated glucose    Pt formerly drank 8 cokes per day- has cut way back  Still high simple carb diet however Wt is stable disc imp of low glycemic diet and wt loss to prevent DM2  Re check a1c today      Relevant Orders   Hemoglobin A1c   Fibromyalgia    Stable per pt with tramadol/flexeril and sertraline  She walks more now with 2 dogs which is helpful  No trigger points today  Symptoms vary with weather        Hyperlipidemia    Simvastatin and diet Disc goals for lipids and reasons to control them Rev labs with pt (may) Re check today  Rev low sat fat diet in detail  Enc less bacon/red meat and butter       Relevant Orders   Comprehensive metabolic panel   Lipid panel   HYPOKALEMIA    Lab today  Refilled K supplement       TOBACCO USE    Disc in detail risks of smoking and possible outcomes including copd, vascular/ heart disease, cancer , respiratory and sinus infections  Pt voices understanding Pt has no desire to quit  Info given on lung cancer screening program/not  interested currently  Aside from occ smokers cough-no symptoms of copd  Continue to monitor Flu shot today

## 2017-10-08 NOTE — Assessment & Plan Note (Signed)
Simvastatin and diet Disc goals for lipids and reasons to control them Rev labs with pt (may) Re check today  Rev low sat fat diet in detail  Enc less bacon/red meat and butter

## 2017-10-08 NOTE — Assessment & Plan Note (Addendum)
Disc in detail risks of smoking and possible outcomes including copd, vascular/ heart disease, cancer , respiratory and sinus infections  Pt voices understanding Pt has no desire to quit  Info given on lung cancer screening program/not interested currently  Aside from occ smokers cough-no symptoms of copd  Continue to monitor Flu shot today

## 2017-10-08 NOTE — Assessment & Plan Note (Signed)
This trends with her fibromyalgia and depression  Fairly stable  Lab today incl cbc and tsh  Enc exercise and better diet /more balanced

## 2017-10-08 NOTE — Assessment & Plan Note (Signed)
Lab today  Refilled K supplement  

## 2017-10-08 NOTE — Assessment & Plan Note (Signed)
Pt formerly drank 8 cokes per day- has cut way back  Still high simple carb diet however Wt is stable disc imp of low glycemic diet and wt loss to prevent DM2  Re check a1c today

## 2017-10-09 ENCOUNTER — Encounter: Payer: Self-pay | Admitting: *Deleted

## 2017-11-02 ENCOUNTER — Other Ambulatory Visit: Payer: Self-pay | Admitting: Family Medicine

## 2017-11-05 NOTE — Telephone Encounter (Signed)
Last OV was 10/08/17, last filled on 07/02/17 #60 tablets with 3 additional refills, please advise

## 2017-11-05 NOTE — Telephone Encounter (Signed)
Px written for call in   

## 2017-11-05 NOTE — Telephone Encounter (Signed)
Rx called in as prescribed 

## 2017-11-16 ENCOUNTER — Other Ambulatory Visit: Payer: Self-pay | Admitting: Family Medicine

## 2017-11-16 NOTE — Telephone Encounter (Signed)
Last f/u was 10/08/17, last filled on 09/11/16 #30 tabs with 0 refills, please advise

## 2017-11-16 NOTE — Telephone Encounter (Signed)
Will refill electronically  

## 2018-02-13 ENCOUNTER — Other Ambulatory Visit: Payer: Self-pay | Admitting: Family Medicine

## 2018-02-13 MED ORDER — CYCLOBENZAPRINE HCL 10 MG PO TABS: ORAL_TABLET | ORAL | 3 refills | Status: DC

## 2018-02-13 NOTE — Telephone Encounter (Signed)
Refill request Flexeril  LOV 10/08/2017    Ph[facility]acy [facility]  [address]  [place]

## 2018-02-13 NOTE — Telephone Encounter (Signed)
Copied from CRM [id number]. Topic: Quick Communication - Rx Refill/Question >> Feb 13, 2018 10:24 AM Landry MellowFoltz, Melissa J wrote: Medication: cyclobenzaprine (FLEXERIL) 10 MG tablet Has the patient contacted their pharmacy? Yes.   (Agent: If no, request that the patient contact the pharmacy for the refill.) Preferred Pharmacy (with phone number [facility] street [facility] [address]  Agent: Please be advised that RX refills may take up to 3 business days. We ask that you follow-up with your pharmacy.

## 2018-02-13 NOTE — Telephone Encounter (Signed)
[facility] Last refilled # 90 x 3 on 10/08/17. Last seen 10/08/17.

## 2018-03-08 ENCOUNTER — Other Ambulatory Visit: Payer: Self-pay | Admitting: Family Medicine

## 2018-03-08 MED ORDER — TRAMADOL HCL 50 MG PO TABS: ORAL_TABLET | ORAL | 3 refills | Status: DC

## 2018-03-08 NOTE — Telephone Encounter (Signed)
LOV  10/08/17 Dr. Milinda Antis Last refill 11/05/17  # 60  3 refills

## 2018-03-08 NOTE — Telephone Encounter (Signed)
Copied from CRM [id number]. Topic: Quick Communication - See Telephone Encounter >> Mar 08, 2018  8:13 AM Waymon Amato wrote: Pt is needing a refill on [place] pt states it has been three days since she called the pharmacy on this request   Best number [phone]   [facility] [address]

## 2018-03-08 NOTE — Telephone Encounter (Signed)
[facility] She states "I am getting very aggravated." I advised pt that we did not have any electronic requests from the pharmacy and that possibly it was faxed. I advised I could update to high priority.

## 2018-04-08 ENCOUNTER — Other Ambulatory Visit: Payer: Self-pay | Admitting: *Deleted

## 2018-04-08 MED ORDER — SIMVASTATIN 20 MG PO TABS: ORAL_TABLET | ORAL | 1 refills | Status: DC

## 2018-06-05 ENCOUNTER — Encounter: Payer: Self-pay | Admitting: Family Medicine

## 2018-06-10 ENCOUNTER — Encounter: Payer: Self-pay | Admitting: Family Medicine

## 2018-06-10 ENCOUNTER — Ambulatory Visit (INDEPENDENT_AMBULATORY_CARE_PROVIDER_SITE_OTHER)
Admission: RE | Admit: 2018-06-10 | Discharge: 2018-06-10 | Disposition: A | Discharge status: Home / Self Care | Payer: Federal, State, Local not specified - PPO | Source: Ambulatory Visit | Attending: Family Medicine | Admitting: Family Medicine

## 2018-06-10 ENCOUNTER — Ambulatory Visit: Payer: Federal, State, Local not specified - PPO | Admitting: Family Medicine

## 2018-06-10 VITALS — BP 126/70 | HR 103 | Temp 98.5°F | Ht 68.75 in | Wt 168.8 lb

## 2018-06-10 DIAGNOSIS — R490 Dysphonia: Secondary | ICD-10-CM

## 2018-06-10 DIAGNOSIS — F339 Major depressive disorder, recurrent, unspecified: Secondary | ICD-10-CM

## 2018-06-10 DIAGNOSIS — K219 Gastro-esophageal reflux disease without esophagitis: Secondary | ICD-10-CM | POA: Diagnosis not present

## 2018-06-10 DIAGNOSIS — F17200 Nicotine dependence, unspecified, uncomplicated: Secondary | ICD-10-CM

## 2018-06-10 DIAGNOSIS — J209 Acute bronchitis, unspecified: Secondary | ICD-10-CM

## 2018-06-10 DIAGNOSIS — R05 Cough: Secondary | ICD-10-CM | POA: Diagnosis not present

## 2018-06-10 MED ORDER — RANITIDINE HCL 150 MG PO CAPS: 150.0000 mg | ORAL_CAPSULE | Freq: Two times a day (BID) | ORAL | 11 refills | Status: DC

## 2018-06-10 MED ORDER — CYCLOBENZAPRINE HCL 10 MG PO TABS: ORAL_TABLET | ORAL | 3 refills | Status: DC

## 2018-06-10 MED ORDER — PREDNISONE 10 MG PO TABS: ORAL_TABLET | ORAL | 0 refills | Status: DC

## 2018-06-10 MED ORDER — DOXYCYCLINE HYCLATE 100 MG PO TABS: 100.0000 mg | ORAL_TABLET | Freq: Two times a day (BID) | ORAL | 0 refills | Status: DC

## 2018-06-10 NOTE — Patient Instructions (Signed)
Drink fluids Keep working on quitting smoking  mucinex over the counter for chest congestion is helpful  Take prednisone as directed  Doxycycline as directed  These are for bronchitis    Zantac for extra acid reflux control   If no improvement in hoarseness we will refer you to a specialist   Chest xray today  Update if not starting to improve in a week or if worsening

## 2018-06-10 NOTE — Assessment & Plan Note (Signed)
Stable with sertraline

## 2018-06-10 NOTE — Assessment & Plan Note (Signed)
Disc in detail risks of smoking and possible outcomes including copd, vascular/ heart disease, cancer , respiratory and sinus infections  Pt voices understanding She has cut back to 1/2 ppd  Unsure if she can quit

## 2018-06-10 NOTE — Assessment & Plan Note (Signed)
Suspect multifactorial in a smoker  Add zantac for GERD control tx acute bronchitis  Prednisone and doxycycline cxr today  Voice rest when needed  Strongly enc to quit smoking  If not improved after tx-inst to call for ENT ref

## 2018-06-10 NOTE — Assessment & Plan Note (Signed)
Ongoing prod cough in smoker with reactive airways Also laryngitis symptoms Cover with doxycycline given length of illness Also prednisone taper-disc side effects Fluids/rest Expectorant prn Enc strongly to quit smoking

## 2018-06-10 NOTE — Progress Notes (Signed)
Subjective:    Patient ID: Angela Petty, female    DOB: 1961/08/25, 57 y.o.   MRN: [id number]  HPI Here for symptom of hoarse voice  Has been going on since last visit -getting worse   Feels mucous in her throat  Clears her throat a lot   Some runny /stuffy nose with pnd  Took 2 benadryl this am   Feels tightness in chest Throat is rough feeling/ not sore No fever   Some cough- prod of green sputum - more than a month  Some wheezing - does not use inhaler   Smoking status- less than 1/2 ppd  Is working on cessation   Takes omprazole 20 mg  Still having some breakthrough symptoms  Some heartburn and indigestion   Patient Active Problem List   Diagnosis Date Noted  . Acute bronchitis 06/10/2018  . Hoarseness 06/10/2018  . GERD (gastroesophageal reflux disease) 06/10/2018  . Elevated glucose 03/04/2017  . Primary osteoarthritis of first carpometacarpal joint of left hand 02/22/2016  . BPV (benign positional vertigo) 08/27/2015  . HYPOKALEMIA 12/20/2009  . Hyperlipidemia 11/30/2009  . IBS 11/30/2009  . TOBACCO USE 08/26/2008  . Depression 08/26/2008  . Fibromyalgia 08/26/2008  . Chronic fatigue 08/26/2008   Past Medical History:  Diagnosis Date  . Carpal tunnel syndrome   . Depression   . Fibromyalgia   . GERD (gastroesophageal reflux disease)   . Hyperlipidemia   . Hypokalemia   . 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 Tobacco abuse    Past Surgical History:  Procedure Laterality Date  . HIP FRACTURE SURGERY  1995   left  . LEG SURGERY  age 27   leg surgery with screws placed   Social History   Tobacco Use  . Smoking status: Current Every Day Smoker    Packs/day: 0.50    Types: Cigarettes  . Smokeless tobacco: Never Used  Substance Use Topics  . Alcohol use: No    Alcohol/week: 0.0 oz  . Drug use: Yes    Types: Marijuana    Comment: said uses it for pain   Family History  Problem Relation Age of Onset  . Obesity Father   . Arthritis  Father   . Heart disease Paternal Uncle        MI  . Diabetes Maternal Grandmother   . Heart disease Paternal Grandfather        MI  . Cancer Cousin        leukemia   Allergies  Allergen Reactions  . Diclofenac Sodium     REACTION: GI  . Ibuprofen     REACTION: GI  . Naproxen Sodium     REACTION: GI, and did not work  . Rofecoxib     REACTION: GI   Current Outpatient Medications on File Prior to Visit  Medication Sig Dispense Refill  . meclizine (ANTIVERT) 25 MG tablet TAKE 1 TABLET (25 MG TOTAL) BY MOUTH 3 (THREE) TIMES DAILY AS NEEDED FOR DIZZINESS. 30 tablet 0  . Multiple Vitamin (MULTIVITAMIN) tablet Take 1 tablet by mouth daily.      . Omega-3 Fatty Acids (FISH OIL PO) Take 1 capsule by mouth daily.    Marland Kitchen omeprazole (PRILOSEC) 20 MG capsule Take 1 capsule (20 mg total) by mouth daily. 90 capsule 3  . potassium chloride (MICRO-K) 10 MEQ CR capsule TAKE 2 CAPSULES EVERY DAY 180 capsule 3  . sertraline (ZOLOFT) 100 MG tablet Take 1 1/2 tablets once daily by mouth  135 tablet 3  . simvastatin (ZOCOR) 20 MG tablet TAKE 2 TABLETS (40 MG TOTAL) BY MOUTH AT BEDTIME. 180 tablet 1  . traMADol (ULTRAM) 50 MG tablet TAKE 1 TABLET TWICE DAILY AS NEEDED FOR PAIN 60 tablet 3   No current facility-administered medications on file prior to visit.     Review of Systems  Constitutional: Positive for appetite change and fatigue. Negative for fever.  HENT: Positive for congestion, postnasal drip, rhinorrhea, sinus pressure, sneezing, sore throat and voice change. Negative for ear pain and trouble swallowing.   Eyes: Negative for pain and discharge.  Respiratory: Positive for cough and wheezing. Negative for shortness of breath and stridor.   Cardiovascular: Negative for chest pain.  Gastrointestinal: Negative for abdominal distention, abdominal pain, blood in stool, diarrhea, nausea and vomiting.       Heartburn  Genitourinary: Negative for frequency, hematuria and urgency.  Musculoskeletal:  Negative for arthralgias and myalgias.  Skin: Negative for rash.  Neurological: Positive for headaches. Negative for dizziness, weakness and light-headedness.  Psychiatric/Behavioral: Negative for confusion and dysphoric mood.       Objective:   Physical Exam  Constitutional: She appears well-developed and well-nourished. No distress.  Well appearing  HENT:  Head: Normocephalic and atraumatic.  Right Ear: External ear normal.  Left Ear: External ear normal.  Mouth/Throat: Oropharynx is clear and moist.  Nares are injected and congested  No sinus tenderness Clear rhinorrhea and post nasal drip   Hoarse voice   Eyes: Pupils are equal, round, and reactive to light. Conjunctivae and EOM are normal. Right eye exhibits no discharge. Left eye exhibits no discharge. No scleral icterus.  Neck: Normal range of motion. Neck supple.  Cardiovascular: Normal rate, regular rhythm and normal heart sounds.  Pulmonary/Chest: Effort normal. No stridor. No respiratory distress. She has wheezes. She has no rales. She exhibits no tenderness.  Diffusely distant bs Harsh bs with scattered rhonchi No rales Wheeze on forced exp only  Abdominal: Soft. Bowel sounds are normal. She exhibits no distension and no mass. There is no tenderness. There is no rebound and no guarding.  Lymphadenopathy:    She has no cervical adenopathy.  Neurological: She is alert. She displays normal reflexes. No cranial nerve deficit. She exhibits normal muscle tone. Coordination normal.  Skin: Skin is warm and dry. No rash noted. No erythema.  Psychiatric: She has a normal mood and affect.  Pleasant           Assessment & Plan:   Problem List Items Addressed This Visit      Respiratory   Acute bronchitis - Primary    Ongoing prod cough in smoker with reactive airways Also laryngitis symptoms Cover with doxycycline given length of illness Also prednisone taper-disc side effects Fluids/rest Expectorant prn Enc  strongly to quit smoking       Relevant Orders   DG Chest 2 View (Completed)     Digestive   GERD (gastroesophageal reflux disease)    Worse lately (heartburn) which could add to hoarse voice  Continue omeprazole 20 mg daily  Add zantac 150 mg bid  Update if not starting to improve in a week or if worsening        Relevant Medications   ranitidine (ZANTAC) 150 MG capsule     Other   Depression    Stable with sertraline       Hoarseness    Suspect multifactorial in a smoker  Add zantac for GERD control tx acute bronchitis  Prednisone and doxycycline cxr today  Voice rest when needed  Strongly enc to quit smoking  If not improved after tx-inst to call for ENT ref       Relevant Orders   DG Chest 2 View (Completed)   TOBACCO USE    Disc in detail risks of smoking and possible outcomes including copd, vascular/ heart disease, cancer , respiratory and sinus infections  Pt voices understanding She has cut back to 1/2 ppd  Unsure if she can quit        Relevant Orders   DG Chest 2 View (Completed)

## 2018-06-10 NOTE — Assessment & Plan Note (Signed)
Worse lately (heartburn) which could add to hoarse voice  Continue omeprazole 20 mg daily  Add zantac 150 mg bid  Update if not starting to improve in a week or if worsening

## 2018-07-02 ENCOUNTER — Encounter: Payer: Self-pay | Admitting: Family Medicine

## 2018-07-03 ENCOUNTER — Encounter: Payer: Self-pay | Admitting: Family Medicine

## 2018-07-03 DIAGNOSIS — R490 Dysphonia: Secondary | ICD-10-CM

## 2018-07-04 NOTE — Telephone Encounter (Signed)
Referral done Will route to [facility]  

## 2018-07-10 ENCOUNTER — Other Ambulatory Visit: Payer: Self-pay

## 2018-07-10 MED ORDER — TRAMADOL HCL 50 MG PO TABS: ORAL_TABLET | ORAL | 3 refills | Status: DC

## 2018-07-10 NOTE — Telephone Encounter (Signed)
Name of Medication: Tramadol [facility] [address] Last Fill or Written Date and Quantity: 03/08/2018 #60 with 3 refills Last Office Visit and Type: 06/10/2018 for an acute appointment Next Office Visit and Type: non scheduled Last Controlled Substance Agreement Date: 11/11/2013 Last UDS: 11/11/2013

## 2018-07-16 DIAGNOSIS — F17200 Nicotine dependence, unspecified, uncomplicated: Secondary | ICD-10-CM | POA: Diagnosis not present

## 2018-07-16 DIAGNOSIS — K219 Gastro-esophageal reflux disease without esophagitis: Secondary | ICD-10-CM | POA: Diagnosis not present

## 2018-07-16 DIAGNOSIS — J381 Polyp of vocal cord and larynx: Secondary | ICD-10-CM | POA: Diagnosis not present

## 2018-08-09 ENCOUNTER — Other Ambulatory Visit: Payer: Self-pay | Admitting: Otolaryngology

## 2018-08-09 DIAGNOSIS — J381 Polyp of vocal cord and larynx: Secondary | ICD-10-CM | POA: Diagnosis not present

## 2018-08-09 DIAGNOSIS — K219 Gastro-esophageal reflux disease without esophagitis: Secondary | ICD-10-CM | POA: Diagnosis not present

## 2018-08-09 DIAGNOSIS — R490 Dysphonia: Secondary | ICD-10-CM | POA: Diagnosis not present

## 2018-09-30 ENCOUNTER — Other Ambulatory Visit: Payer: Self-pay | Admitting: *Deleted

## 2018-09-30 MED ORDER — SIMVASTATIN 20 MG PO TABS: ORAL_TABLET | ORAL | 1 refills | Status: DC

## 2018-09-30 NOTE — Telephone Encounter (Signed)
Only acute appt on 06/10/18 but no recent f/u or CPE and no future appts, last filled on 06/10/18 #90 tabs with 3 refills, please advise

## 2018-09-30 NOTE — Telephone Encounter (Signed)
Too early

## 2018-10-08 ENCOUNTER — Other Ambulatory Visit: Payer: Self-pay

## 2018-10-08 MED ORDER — CYCLOBENZAPRINE HCL 10 MG PO TABS: ORAL_TABLET | ORAL | 1 refills | Status: DC

## 2018-10-08 NOTE — Telephone Encounter (Signed)
Please schedule a f/u or PE in January and refill until then  Thanks

## 2018-10-08 NOTE — Telephone Encounter (Signed)
Name of Medication: cyclobenzaprine 10 mg Name of Pharmacy: [facility] [address] Last Fill or Written Date and Quantity: # 90 x 3 on 06/10/18  (pt takes 3 tabs a day) Last Office Visit and Type: 06/10/18 acute visit; no CPX recent Next Office Visit and Type: none scheduled  Pt said having usual neck and shoulder pain.

## 2018-10-08 NOTE — Telephone Encounter (Signed)
Carrie scheduled appt and med filled

## 2018-11-07 ENCOUNTER — Other Ambulatory Visit: Payer: Self-pay | Admitting: *Deleted

## 2018-11-07 MED ORDER — TRAMADOL HCL 50 MG PO TABS: ORAL_TABLET | ORAL | 0 refills | Status: DC

## 2018-11-07 NOTE — Telephone Encounter (Signed)
Name of Medication: Tramadol [facility] of [facility] [address] Last Fill or Written Date and Quantity: 07/10/18 #60 with 3 refills Last Office Visit and Type: 06/10/2018 for an acute appointment Next Office Visit and Type: CPE on 11/13/18 Last Controlled Substance Agreement Date: 11/11/2013 Last UDS: 11/11/2013

## 2018-11-13 ENCOUNTER — Encounter: Payer: Self-pay | Admitting: Family Medicine

## 2018-11-13 ENCOUNTER — Ambulatory Visit (INDEPENDENT_AMBULATORY_CARE_PROVIDER_SITE_OTHER): Payer: Federal, State, Local not specified - PPO | Admitting: Family Medicine

## 2018-11-13 VITALS — BP 130/82 | HR 106 | Temp 98.0°F | Ht 68.75 in | Wt 169.5 lb

## 2018-11-13 DIAGNOSIS — E7800 Pure hypercholesterolemia, unspecified: Secondary | ICD-10-CM

## 2018-11-13 DIAGNOSIS — F17200 Nicotine dependence, unspecified, uncomplicated: Secondary | ICD-10-CM

## 2018-11-13 DIAGNOSIS — M797 Fibromyalgia: Secondary | ICD-10-CM

## 2018-11-13 DIAGNOSIS — K219 Gastro-esophageal reflux disease without esophagitis: Secondary | ICD-10-CM | POA: Diagnosis not present

## 2018-11-13 DIAGNOSIS — R7309 Other abnormal glucose: Secondary | ICD-10-CM | POA: Diagnosis not present

## 2018-11-13 DIAGNOSIS — F339 Major depressive disorder, recurrent, unspecified: Secondary | ICD-10-CM | POA: Diagnosis not present

## 2018-11-13 DIAGNOSIS — Z23 Encounter for immunization: Secondary | ICD-10-CM | POA: Diagnosis not present

## 2018-11-13 DIAGNOSIS — Z0000 Encounter for general adult medical examination without abnormal findings: Secondary | ICD-10-CM | POA: Diagnosis not present

## 2018-11-13 LAB — CBC WITH DIFFERENTIAL/PLATELET
Basophils Absolute: 0 10*3/uL (ref 0.0–0.1)
Basophils Relative: 0.4 % (ref 0.0–3.0)
Eosinophils Absolute: 0.4 10*3/uL (ref 0.0–0.7)
Eosinophils Relative: 5.1 % — ABNORMAL HIGH (ref 0.0–5.0)
HCT: 42.6 % (ref 36.0–46.0)
Hemoglobin: 14.4 g/dL (ref 12.0–15.0)
LYMPHS PCT: 28.8 % (ref 12.0–46.0)
Lymphs Abs: 2.3 10*3/uL (ref 0.7–4.0)
MCHC: 33.7 g/dL (ref 30.0–36.0)
MCV: 90.1 fl (ref 78.0–100.0)
Monocytes Absolute: 0.6 10*3/uL (ref 0.1–1.0)
Monocytes Relative: 7.9 % (ref 3.0–12.0)
Neutro Abs: 4.6 10*3/uL (ref 1.4–7.7)
Neutrophils Relative %: 57.8 % (ref 43.0–77.0)
Platelets: 303 10*3/uL (ref 150.0–400.0)
RBC: 4.72 Mil/uL (ref 3.87–5.11)
RDW: 12.8 % (ref 11.5–15.5)
WBC: 8 10*3/uL (ref 4.0–10.5)

## 2018-11-13 LAB — LIPID PANEL
Cholesterol: 193 mg/dL (ref 0–200)
HDL: 53.1 mg/dL (ref >39.00)
LDL Cholesterol: 108 mg/dL — ABNORMAL HIGH (ref 0–99)
NONHDL: 139.79
Total CHOL/HDL Ratio: 4
Triglycerides: 161 mg/dL — ABNORMAL HIGH (ref 0.0–149.0)
VLDL: 32.2 mg/dL (ref 0.0–40.0)

## 2018-11-13 LAB — COMPREHENSIVE METABOLIC PANEL
ALK PHOS: 81 U/L (ref 39–117)
ALT: 22 U/L (ref 0–35)
AST: 23 U/L (ref 0–37)
Albumin: 4.5 g/dL (ref 3.5–5.2)
BUN: 11 mg/dL (ref 6–23)
CO2: 31 mEq/L (ref 19–32)
Calcium: 9.7 mg/dL (ref 8.4–10.5)
Chloride: 103 mEq/L (ref 96–112)
Creatinine, Ser: 0.67 mg/dL (ref 0.40–1.20)
GFR: 96.88 mL/min (ref >60.00)
GLUCOSE: 98 mg/dL (ref 70–99)
Potassium: 4.2 mEq/L (ref 3.5–5.1)
Sodium: 140 mEq/L (ref 135–145)
TOTAL PROTEIN: 7.1 g/dL (ref 6.0–8.3)
Total Bilirubin: 0.4 mg/dL (ref 0.2–1.2)

## 2018-11-13 LAB — TSH: TSH: 1.56 u[IU]/mL (ref 0.35–4.50)

## 2018-11-13 LAB — HEMOGLOBIN A1C: Hgb A1c MFr Bld: 5.6 % (ref 4.6–6.5)

## 2018-11-13 MED ORDER — CYCLOBENZAPRINE HCL 10 MG PO TABS: ORAL_TABLET | ORAL | 3 refills | Status: DC

## 2018-11-13 MED ORDER — TRAMADOL HCL 50 MG PO TABS: ORAL_TABLET | ORAL | 0 refills | Status: DC

## 2018-11-13 MED ORDER — SERTRALINE HCL 100 MG PO TABS: ORAL_TABLET | ORAL | 3 refills | Status: DC

## 2018-11-13 MED ORDER — SIMVASTATIN 20 MG PO TABS: ORAL_TABLET | ORAL | 3 refills | Status: DC

## 2018-11-13 MED ORDER — OMEPRAZOLE 20 MG PO CPDR: 20.0000 mg | DELAYED_RELEASE_CAPSULE | Freq: Every day | ORAL | 3 refills | Status: DC

## 2018-11-13 MED ORDER — POTASSIUM CHLORIDE ER 10 MEQ PO CPCR: ORAL_CAPSULE | ORAL | 3 refills | Status: DC

## 2018-11-13 NOTE — Progress Notes (Signed)
Subjective:    Patient ID: Angela Petty, female    DOB: February 13, 1961, 58 y.o.   MRN: [id number]  HPI  Here for health maintenance exam and to review chronic medical problems    Feeling ok   Tripped over a cord and bruised L knee - is getting better    Wt Readings from Last 3 Encounters:  11/13/18 169 lb 8 oz (76.9 kg)  06/10/18 168 lb 12 oz (76.5 kg)  10/08/17 172 lb 8 oz (78.2 kg)  stable  Eating better than she was  Exercise -not a lot lately (depends on fibro symptoms)  Cleaning/housework  Does make the effort to get outdoors  25.21 kg/m   Flu vaccine-given today   Colon cancer screening not interested in any   Mammogram /breast cancer screening - not interested  Self exam -no lumps    Tetanus shot 12/13 PNA vaccine 1/11 - wants booster today   No periods  Did not want a gyn visit    BP Readings from Last 3 Encounters:  11/13/18 130/82  06/10/18 126/70  10/08/17 120/64   Pulse Readings from Last 3 Encounters:  11/13/18 (!) 106  06/10/18 (!) 103  10/08/17 98  pulse tends to be high  Occasionally notices it - for years  Caffeine - cokes - cut down to 4 ( from 6 to 8)   Due for labs today   H/o elevated glucose Lab Results  Component Value Date   HGBA1C 5.7 10/08/2017   Hyperlipidemia zocor and diet  Lab Results  Component Value Date   CHOL 196 10/08/2017   HDL 48.20 10/08/2017   LDLCALC 118 (H) 10/08/2017   LDLDIRECT 130.8 11/11/2013   TRIG 151.0 (H) 10/08/2017   CHOLHDL 4 10/08/2017   Smoking status - still smoking 5 or less cig per day  Is thinking about quitting   Had a vocal cord removed  Doing well   GERD - takes omeprazole  Used to take zantac prn    Patient Active Problem List   Diagnosis Date Noted  . Routine general medical examination at a health care facility 11/13/2018  . Hoarseness 06/10/2018  . GERD (gastroesophageal reflux disease) 06/10/2018  . Elevated glucose 03/04/2017  . Primary osteoarthritis of first  carpometacarpal joint of left hand 02/22/2016  . BPV (benign positional vertigo) 08/27/2015  . HYPOKALEMIA 12/20/2009  . Hyperlipidemia 11/30/2009  . IBS 11/30/2009  . TOBACCO USE 08/26/2008  . Depression 08/26/2008  . Fibromyalgia 08/26/2008  . Chronic fatigue 08/26/2008   Past Medical History:  Diagnosis Date  . Carpal tunnel syndrome   . Depression   . Fibromyalgia   . GERD (gastroesophageal reflux disease)   . Hyperlipidemia   . Hypokalemia   . 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 Tobacco abuse    Past Surgical History:  Procedure Laterality Date  . HIP FRACTURE SURGERY  1995   left  . LEG SURGERY  age 89   leg surgery with screws placed   Social History   Tobacco Use  . Smoking status: Current Every Day Smoker    Packs/day: 0.25    Types: Cigarettes  . Smokeless tobacco: Never Used  Substance Use Topics  . Alcohol use: No    Alcohol/week: 0.0 standard drinks  . Drug use: Yes    Types: Marijuana    Comment: said uses it for pain   Family History  Problem Relation Age of Onset  . Obesity Father   . Arthritis Father   .  Heart disease Paternal Uncle        MI  . Diabetes Maternal Grandmother   . Heart disease Paternal Grandfather        MI  . Cancer Cousin        leukemia   Allergies  Allergen Reactions  . Diclofenac Sodium     REACTION: GI  . Ibuprofen     REACTION: GI  . Naproxen Sodium     REACTION: GI, and did not work  . Rofecoxib     REACTION: GI   Current Outpatient Medications on File Prior to Visit  Medication Sig Dispense Refill  . meclizine (ANTIVERT) 25 MG tablet TAKE 1 TABLET (25 MG TOTAL) BY MOUTH 3 (THREE) TIMES DAILY AS NEEDED FOR DIZZINESS. 30 tablet 0  . Multiple Vitamin (MULTIVITAMIN) tablet Take 1 tablet by mouth daily.      . Omega-3 Fatty Acids (FISH OIL PO) Take 1 capsule by mouth daily.     No current facility-administered medications on file prior to visit.     Review of Systems  Constitutional: Negative for  activity change, appetite change, fatigue, fever and unexpected weight change.  HENT: Negative for congestion, ear pain, rhinorrhea, sinus pressure and sore throat.   Eyes: Negative for pain, redness and visual disturbance.  Respiratory: Negative for cough, shortness of breath and wheezing.   Cardiovascular: Negative for chest pain and palpitations.  Gastrointestinal: Negative for abdominal pain, blood in stool, constipation and diarrhea.  Endocrine: Negative for polydipsia and polyuria.  Genitourinary: Negative for dysuria, frequency and urgency.  Musculoskeletal: Positive for arthralgias, back pain, myalgias and neck pain. Negative for joint swelling.  Skin: Negative for pallor and rash.  Allergic/Immunologic: Negative for environmental allergies.  Neurological: Negative for dizziness, syncope, facial asymmetry, weakness, light-headedness, numbness and headaches.  Hematological: Negative for adenopathy. Does not bruise/bleed easily.  Psychiatric/Behavioral: Negative for decreased concentration and dysphoric mood. The patient is not nervous/anxious.        Stable mood (depression controlled)        Objective:   Physical Exam Constitutional:      General: She is not in acute distress.    Appearance: Normal appearance. She is well-developed and normal weight.  HENT:     Head: Normocephalic and atraumatic.     Right Ear: Tympanic membrane, ear canal and external ear normal.     Left Ear: Tympanic membrane, ear canal and external ear normal.     Ears:     Comments: Scant cerumen     Nose: Nose normal.     Mouth/Throat:     Mouth: Mucous membranes are moist.     Pharynx: Oropharynx is clear.  Eyes:     General: No scleral icterus.    Conjunctiva/sclera: Conjunctivae normal.     Pupils: Pupils are equal, round, and reactive to light.  Neck:     Musculoskeletal: Normal range of motion and neck supple.     Thyroid: No thyromegaly.     Vascular: No carotid bruit or JVD.    Cardiovascular:     Rate and Rhythm: Regular rhythm. Tachycardia present.     Pulses: Normal pulses.     Heart sounds: Normal heart sounds. No gallop.   Pulmonary:     Effort: Pulmonary effort is normal. No respiratory distress.     Breath sounds: Normal breath sounds. No wheezing or rales.     Comments: Diffusely distant bs No wheezing  Chest:     Chest wall: No tenderness.  Abdominal:     General: Bowel sounds are normal. There is no distension or abdominal bruit.     Palpations: Abdomen is soft. There is no mass.     Tenderness: There is no abdominal tenderness.  Genitourinary:    Comments: Breast exam: No mass, nodules, thickening, tenderness, bulging, retraction, inflamation, nipple discharge or skin changes noted.  No axillary or clavicular LA.     Musculoskeletal: Normal range of motion.        General: No tenderness or deformity.     Right lower leg: No edema.     Left lower leg: No edema.  Lymphadenopathy:     Cervical: No cervical adenopathy.  Skin:    General: Skin is warm and dry.     Capillary Refill: Capillary refill takes less than 2 seconds.     Coloration: Skin is not pale.     Findings: No erythema or rash.  Neurological:     General: No focal deficit present.     Mental Status: She is alert. Mental status is at baseline.     Cranial Nerves: No cranial nerve deficit.     Motor: No weakness or abnormal muscle tone.     Coordination: Coordination normal.     Deep Tendon Reflexes: Reflexes are normal and symmetric. Reflexes normal.  Psychiatric:        Mood and Affect: Mood normal.           Assessment & Plan:   Problem List Items Addressed This Visit      Digestive   GERD (gastroesophageal reflux disease)    Controlled most of the time with omeprazole occ needs H2 and cannot get zantac Enc to try pepcid otc  Also watch diet       Relevant Medications   omeprazole (PRILOSEC) 20 MG capsule     Other   TOBACCO USE    Disc in detail risks of  smoking and possible outcomes including copd, vascular/ heart disease, cancer , respiratory and sinus infections  Pt voices understanding Pt not ready to quit Enc her to think about cutting back       Routine general medical examination at a health care facility - Primary    Reviewed health habits including diet and exercise and skin cancer prevention Reviewed appropriate screening tests for age  Also reviewed health mt list, fam hx and immunization status , as well as social and family history   See HPI Labs ordered for wellness Flu shot and pna 23 vaccine today  Enc smoking cessation  Also low impact exercise  Consider shingrix Pt continues to decline breast or colon or cervical cancer screening today        Relevant Orders   CBC with Differential/Platelet (Completed)   Comprehensive metabolic panel (Completed)   Lipid panel (Completed)   TSH (Completed)   Hyperlipidemia    Disc goals for lipids and reasons to control them Rev last labs with pt Rev low sat fat diet in detail Lipid panel today  Simvastatin and diet       Relevant Medications   simvastatin (ZOCOR) 20 MG tablet   Fibromyalgia    No changes  Continues flexeril and tramadol  zoloft helps mood Enc low impact exercise       Elevated glucose    A1c with labs today  disc imp of low glycemic diet and wt loss to prevent DM2       Relevant Orders   Hemoglobin A1c (Completed)  Depression    Stable with sertraline  Has been unable to come off of in the past Reviewed stressors/ coping techniques/symptoms/ support sources/ tx options and side effects in detail today  Enc good self care      Relevant Medications   sertraline (ZOLOFT) 100 MG tablet    Other Visit Diagnoses    Need for influenza vaccination       Relevant Orders   Flu Vaccine QUAD 6+ mos PF IM (Fluarix Quad PF) (Completed)   Need for 23-polyvalent pneumococcal polysaccharide vaccine       Relevant Orders   Pneumococcal polysaccharide  vaccine 23-valent greater than or equal to 2yo subcutaneous/IM (Completed)

## 2018-11-13 NOTE — Assessment & Plan Note (Signed)
No changes  Continues flexeril and tramadol  zoloft helps mood Enc low impact exercise

## 2018-11-13 NOTE — Assessment & Plan Note (Signed)
Controlled most of the time with omeprazole occ needs H2 and cannot get zantac Enc to try pepcid otc  Also watch diet

## 2018-11-13 NOTE — Assessment & Plan Note (Signed)
Stable with sertraline  Has been unable to come off of in the past Reviewed stressors/ coping techniques/symptoms/ support sources/ tx options and side effects in detail today  Enc good self care

## 2018-11-13 NOTE — Assessment & Plan Note (Signed)
Reviewed health habits including diet and exercise and skin cancer prevention Reviewed appropriate screening tests for age  Also reviewed health mt list, fam hx and immunization status , as well as social and family history   See HPI Labs ordered for wellness Flu shot and pna 23 vaccine today  Enc smoking cessation  Also low impact exercise  Consider shingrix Pt continues to decline breast or colon or cervical cancer screening today

## 2018-11-13 NOTE — Assessment & Plan Note (Signed)
Disc in detail risks of smoking and possible outcomes including copd, vascular/ heart disease, cancer , respiratory and sinus infections  Pt voices understanding Pt not ready to quit Enc her to think about cutting back

## 2018-11-13 NOTE — Assessment & Plan Note (Signed)
Disc goals for lipids and reasons to control them Rev last labs with pt Rev low sat fat diet in detail Lipid panel today  Simvastatin and diet

## 2018-11-13 NOTE — Patient Instructions (Addendum)
Try pepcid over the counter for acid reflux as directed   Flu shot today  Pneumonia vaccine today  Labs today   If you change your mind about any screening tests let us know

## 2018-11-13 NOTE — Assessment & Plan Note (Signed)
A1c with labs today  disc imp of low glycemic diet and wt loss to prevent DM2

## 2019-01-05 ENCOUNTER — Other Ambulatory Visit: Payer: Self-pay | Admitting: Family Medicine

## 2019-01-06 NOTE — Telephone Encounter (Signed)
Name of Medication: Tramadol Name of Pharmacy: [facility] [address]. Lavonia Dana or Written Date and Quantity: 11/13/18 #60 tabs 0 refill Last Office Visit and Type: CPE on 11/13/18 Next Office Visit and Type: none scheduled Last Controlled Substance Agreement Date: 11/11/13 Last UDS:11/11/13

## 2019-02-11 ENCOUNTER — Other Ambulatory Visit: Payer: Self-pay | Admitting: Family Medicine

## 2019-02-12 NOTE — Telephone Encounter (Signed)
Name of Medication: Tramadol Name of Pharmacy: [facility] [address]. Lavonia Dana or Written Date and Quantity: 01/16/19 #60 tabs 0 refill Last Office Visit and Type: CPE on 11/13/18 Next Office Visit and Type: none scheduled Last Controlled Substance Agreement Date: 11/11/13 Last UDS:11/11/13

## 2019-03-12 ENCOUNTER — Other Ambulatory Visit: Payer: Self-pay | Admitting: Family Medicine

## 2019-03-13 ENCOUNTER — Encounter: Payer: Self-pay | Admitting: *Deleted

## 2019-03-13 NOTE — Telephone Encounter (Signed)
Name of Medication:Tramadol Name of Pharmacy:[facility] [address]. Lavonia Dana or Written Date and Quantity:02/12/19 #60 tabs 0 refill Last Office Visit and Type:CPE on 11/13/18 Next Office Visit and Type:none scheduled Last Controlled Substance Agreement Date:11/11/13 Last UDS:11/11/13

## 2019-07-21 ENCOUNTER — Other Ambulatory Visit: Payer: Self-pay | Admitting: Family Medicine

## 2019-07-22 NOTE — Telephone Encounter (Signed)
Name of [place] Name of Pharmacy:[facility] [address]. Henrietta Dine or Written Date and Quantity:03/13/19 #60 tabs 3 refill Last Office Visit and Type:CPE on 11/13/18 Next Office Visit and Type:none scheduled Last Controlled Substance Agreement Date:11/11/13 Last UDS:11/11/13

## 2019-08-26 DIAGNOSIS — K219 Gastro-esophageal reflux disease without esophagitis: Secondary | ICD-10-CM | POA: Diagnosis not present

## 2019-08-26 DIAGNOSIS — F17200 Nicotine dependence, unspecified, uncomplicated: Secondary | ICD-10-CM | POA: Diagnosis not present

## 2019-09-03 DIAGNOSIS — M25511 Pain in right shoulder: Secondary | ICD-10-CM | POA: Diagnosis not present

## 2019-09-03 DIAGNOSIS — M67911 Unspecified disorder of synovium and tendon, right shoulder: Secondary | ICD-10-CM | POA: Diagnosis not present

## 2019-11-18 ENCOUNTER — Other Ambulatory Visit: Payer: Self-pay | Admitting: Family Medicine

## 2019-11-18 NOTE — Telephone Encounter (Signed)
Please schedule spring PE and refill until then  

## 2019-11-18 NOTE — Telephone Encounter (Signed)
Pt hasn't been seen in a year and no future appts., please advise  

## 2019-11-18 NOTE — Telephone Encounter (Signed)
Angela Petty will reach out to pt to try and get CPE scheduled and med refilled once

## 2019-11-24 ENCOUNTER — Other Ambulatory Visit: Payer: Self-pay | Admitting: *Deleted

## 2019-11-24 MED ORDER — CYCLOBENZAPRINE HCL 10 MG PO TABS: ORAL_TABLET | ORAL | 2 refills | Status: DC

## 2019-11-24 NOTE — Telephone Encounter (Signed)
Last filled on 11/13/18 #270 tabs with 3 refills, CPE scheduled 02/17/20, please advise   [facility] [address]

## 2019-11-25 IMAGING — DX DG CHEST 2V
2 series · 2 of 2 positions shown · non-contrast
Comparison: None.

CLINICAL DATA: Chest tightness and productive cough, history of
tobacco use, initial encounter

EXAM:
CHEST - 2 VIEW

[chest pa]
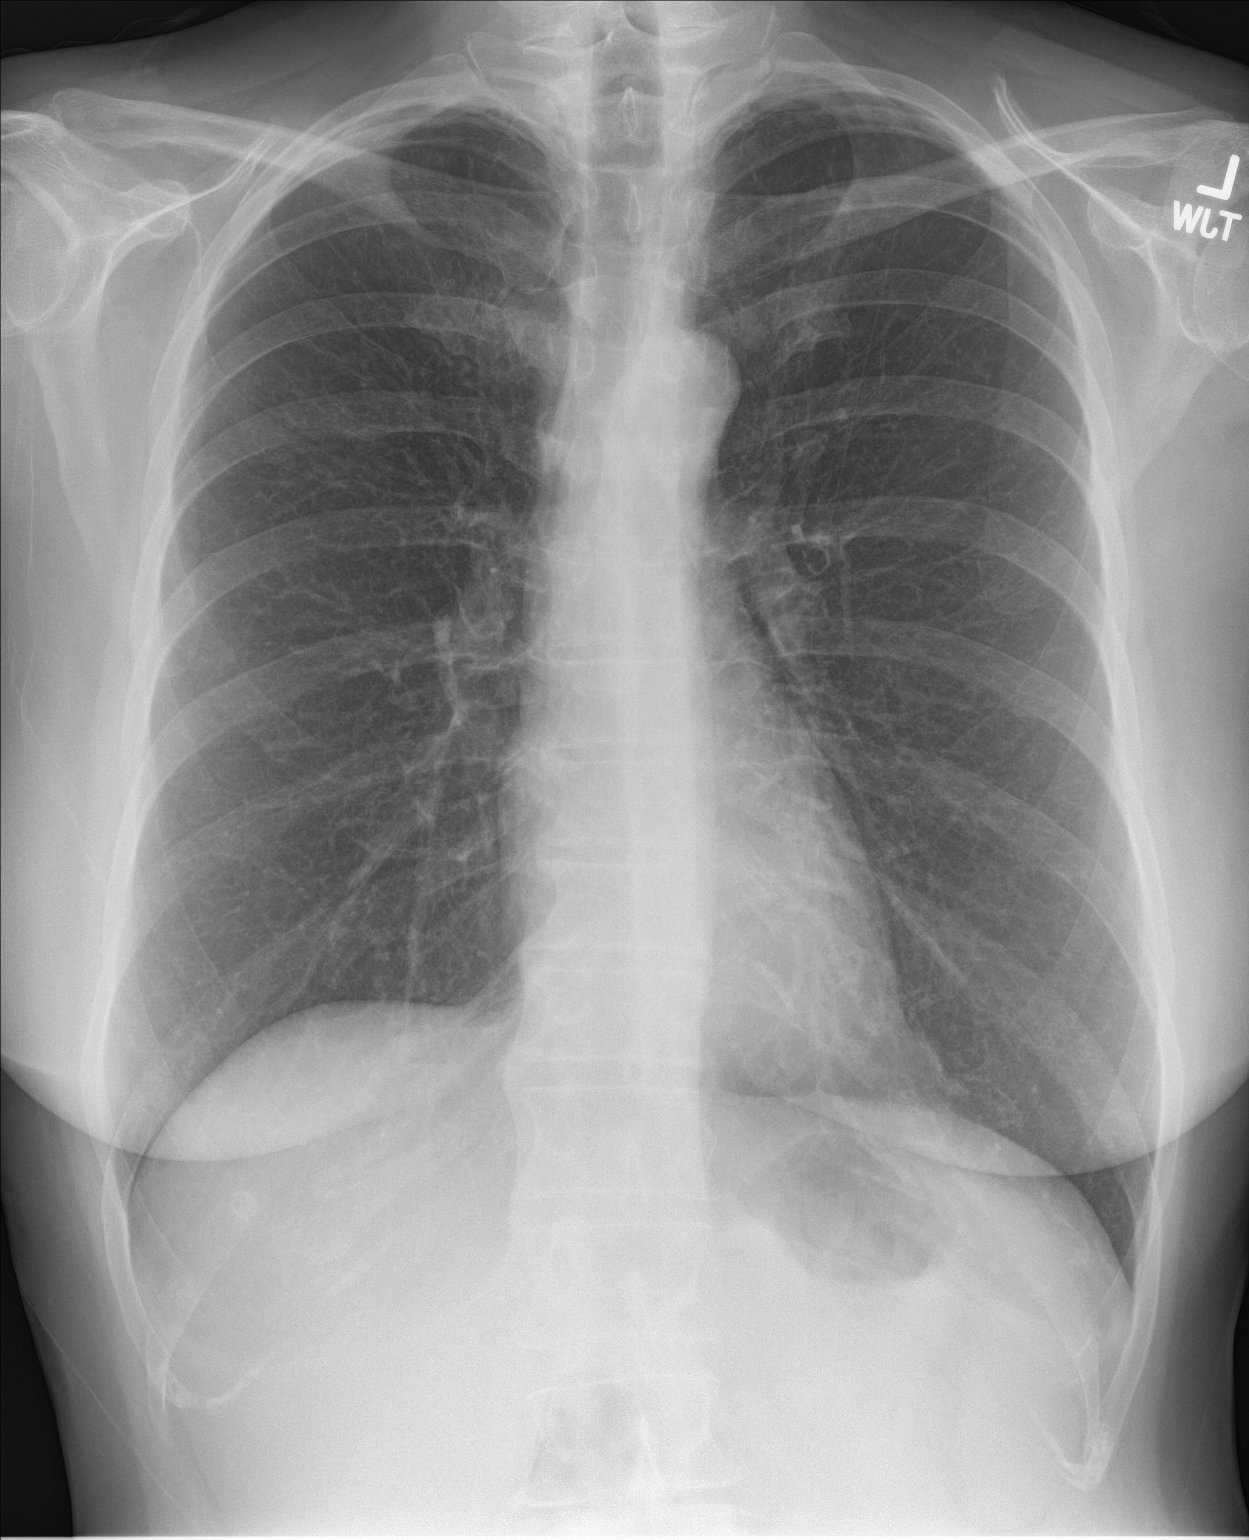

[chest lat]
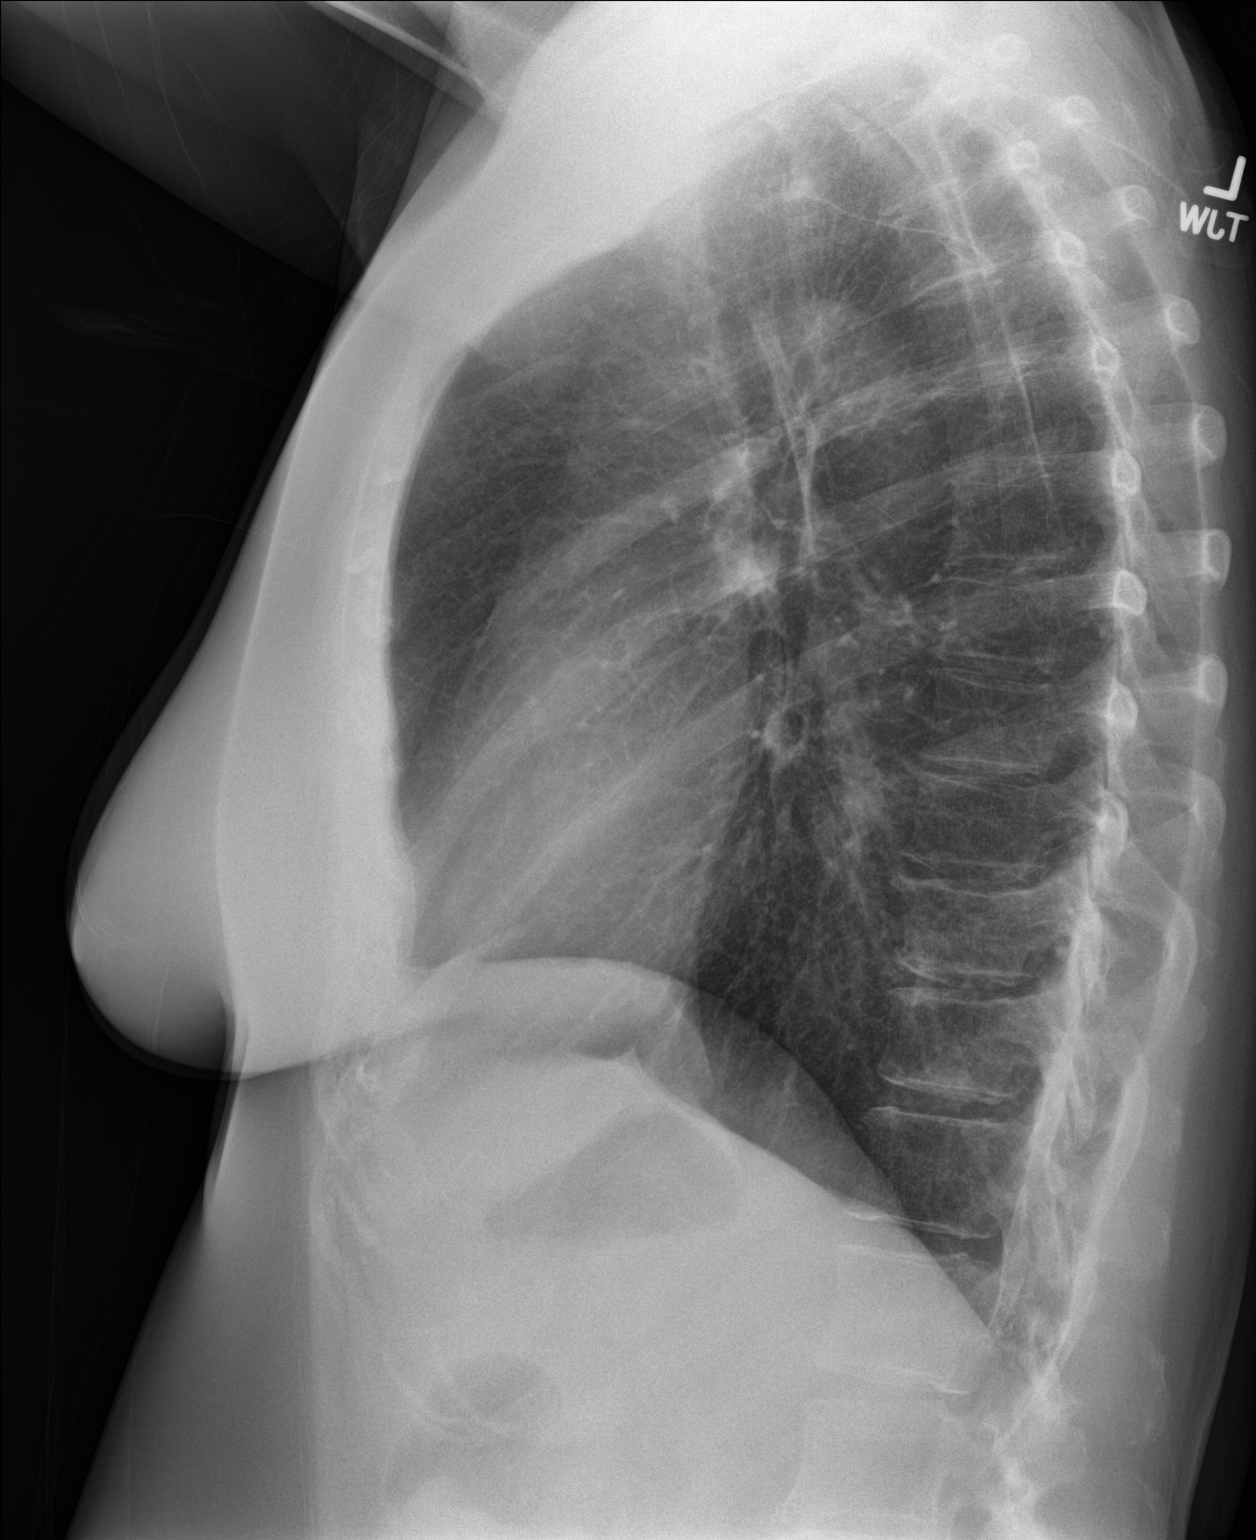

[2 of 2 positions shown; findings below may reference images not displayed]

FINDINGS: The heart size and mediastinal contours are within normal limits.
Both lungs are clear. The visualized skeletal structures are
unremarkable.
IMPRESSION: No active cardiopulmonary disease.

## 2019-12-11 ENCOUNTER — Other Ambulatory Visit: Payer: Self-pay | Admitting: *Deleted

## 2019-12-11 MED ORDER — OMEPRAZOLE 20 MG PO CPDR: 20.0000 mg | DELAYED_RELEASE_CAPSULE | Freq: Every day | ORAL | 0 refills | Status: DC

## 2019-12-19 ENCOUNTER — Other Ambulatory Visit: Payer: Self-pay | Admitting: Family Medicine

## 2019-12-26 ENCOUNTER — Other Ambulatory Visit: Payer: Self-pay | Admitting: Family Medicine

## 2019-12-26 NOTE — Telephone Encounter (Signed)
Name of Medication:Tramadol Name of Pharmacy:[facility] [address] Lavonia Dana or Written Date and Quantity:07/22/19 #60 tabs 3 refill Last Office Visit and Type:CPE on 11/13/18 Next Office Visit and Type:CPE on 02/17/20 Last Controlled Substance Agreement Date:11/11/13 Last UDS:11/11/13

## 2019-12-29 ENCOUNTER — Other Ambulatory Visit: Payer: Self-pay | Admitting: *Deleted

## 2019-12-29 MED ORDER — SIMVASTATIN 20 MG PO TABS: ORAL_TABLET | ORAL | 0 refills | Status: DC

## 2020-02-17 ENCOUNTER — Ambulatory Visit (INDEPENDENT_AMBULATORY_CARE_PROVIDER_SITE_OTHER): Payer: Federal, State, Local not specified - PPO | Admitting: Family Medicine

## 2020-02-17 ENCOUNTER — Other Ambulatory Visit: Payer: Self-pay

## 2020-02-17 ENCOUNTER — Encounter: Payer: Self-pay | Admitting: Family Medicine

## 2020-02-17 VITALS — BP 122/78 | HR 112 | Temp 96.9°F | Ht 69.0 in | Wt 179.4 lb

## 2020-02-17 DIAGNOSIS — F339 Major depressive disorder, recurrent, unspecified: Secondary | ICD-10-CM | POA: Diagnosis not present

## 2020-02-17 DIAGNOSIS — R7309 Other abnormal glucose: Secondary | ICD-10-CM

## 2020-02-17 DIAGNOSIS — Z0000 Encounter for general adult medical examination without abnormal findings: Secondary | ICD-10-CM

## 2020-02-17 DIAGNOSIS — E7800 Pure hypercholesterolemia, unspecified: Secondary | ICD-10-CM | POA: Diagnosis not present

## 2020-02-17 DIAGNOSIS — F17200 Nicotine dependence, unspecified, uncomplicated: Secondary | ICD-10-CM

## 2020-02-17 DIAGNOSIS — E876 Hypokalemia: Secondary | ICD-10-CM | POA: Diagnosis not present

## 2020-02-17 DIAGNOSIS — K219 Gastro-esophageal reflux disease without esophagitis: Secondary | ICD-10-CM

## 2020-02-17 DIAGNOSIS — M797 Fibromyalgia: Secondary | ICD-10-CM

## 2020-02-17 DIAGNOSIS — R5382 Chronic fatigue, unspecified: Secondary | ICD-10-CM | POA: Diagnosis not present

## 2020-02-17 LAB — CBC WITH DIFFERENTIAL/PLATELET
Basophils Absolute: 0 10*3/uL (ref 0.0–0.1)
Basophils Relative: 0.4 % (ref 0.0–3.0)
Eosinophils Absolute: 0.3 10*3/uL (ref 0.0–0.7)
Eosinophils Relative: 4.3 % (ref 0.0–5.0)
HCT: 40.8 % (ref 36.0–46.0)
Hemoglobin: 14.1 g/dL (ref 12.0–15.0)
Lymphocytes Relative: 32.4 % (ref 12.0–46.0)
Lymphs Abs: 2.1 10*3/uL (ref 0.7–4.0)
MCHC: 34.5 g/dL (ref 30.0–36.0)
MCV: 88.6 fl (ref 78.0–100.0)
Monocytes Absolute: 0.5 10*3/uL (ref 0.1–1.0)
Monocytes Relative: 8 % (ref 3.0–12.0)
Neutro Abs: 3.5 10*3/uL (ref 1.4–7.7)
Neutrophils Relative %: 54.9 % (ref 43.0–77.0)
Platelets: 236 10*3/uL (ref 150.0–400.0)
RBC: 4.6 Mil/uL (ref 3.87–5.11)
RDW: 13.1 % (ref 11.5–15.5)
WBC: 6.3 10*3/uL (ref 4.0–10.5)

## 2020-02-17 LAB — LIPID PANEL
Cholesterol: 196 mg/dL (ref 0–200)
HDL: 50.7 mg/dL (ref >39.00)
LDL Cholesterol: 126 mg/dL — ABNORMAL HIGH (ref 0–99)
NonHDL: 145.76
Total CHOL/HDL Ratio: 4
Triglycerides: 97 mg/dL (ref 0.0–149.0)
VLDL: 19.4 mg/dL (ref 0.0–40.0)

## 2020-02-17 LAB — HEMOGLOBIN A1C: Hgb A1c MFr Bld: 5.6 % (ref 4.6–6.5)

## 2020-02-17 LAB — COMPREHENSIVE METABOLIC PANEL
ALT: 24 U/L (ref 0–35)
AST: 22 U/L (ref 0–37)
Albumin: 4.4 g/dL (ref 3.5–5.2)
Alkaline Phosphatase: 90 U/L (ref 39–117)
BUN: 11 mg/dL (ref 6–23)
CO2: 30 mEq/L (ref 19–32)
Calcium: 9.7 mg/dL (ref 8.4–10.5)
Chloride: 102 mEq/L (ref 96–112)
Creatinine, Ser: 0.69 mg/dL (ref 0.40–1.20)
GFR: 87.71 mL/min (ref >60.00)
Glucose, Bld: 110 mg/dL — ABNORMAL HIGH (ref 70–99)
Potassium: 4.4 mEq/L (ref 3.5–5.1)
Sodium: 138 mEq/L (ref 135–145)
Total Bilirubin: 0.4 mg/dL (ref 0.2–1.2)
Total Protein: 6.9 g/dL (ref 6.0–8.3)

## 2020-02-17 LAB — TSH: TSH: 2.25 u[IU]/mL (ref 0.35–4.50)

## 2020-02-17 MED ORDER — POTASSIUM CHLORIDE ER 10 MEQ PO CPCR: ORAL_CAPSULE | ORAL | 3 refills | Status: DC

## 2020-02-17 MED ORDER — SIMVASTATIN 40 MG PO TABS: 40.0000 mg | ORAL_TABLET | Freq: Every day | ORAL | 3 refills | Status: DC

## 2020-02-17 MED ORDER — OMEPRAZOLE 20 MG PO CPDR: 20.0000 mg | DELAYED_RELEASE_CAPSULE | Freq: Every day | ORAL | 3 refills | Status: DC

## 2020-02-17 MED ORDER — SERTRALINE HCL 100 MG PO TABS: 150.0000 mg | ORAL_TABLET | Freq: Every day | ORAL | 3 refills | Status: DC

## 2020-02-17 NOTE — Assessment & Plan Note (Signed)
Myofascial pain continues  Encourage low impact exercise as tolerated   Continues cyclobenzaprine and tramadol prn

## 2020-02-17 NOTE — Assessment & Plan Note (Signed)
Along with chronic myofascial pain  Not clinically changed

## 2020-02-17 NOTE — Assessment & Plan Note (Signed)
Continues omeprazole with good control  

## 2020-02-17 NOTE — Patient Instructions (Addendum)
There are lots of videos with home exercise / even with joint pain and disabilities Chair yoga is popular  Also look into a pedaler to use while sitting   If you are interested in the shingles vaccine series (Shingrix), call your insurance or pharmacy to check on coverage and location it must be given.  If affordable - you can schedule it here or at your pharmacy depending on coverage   If you do get a covid vaccine series - separate from other vaccines by a month   If you can check a pulse - I would like to know if normal when relaxed  60-90 beats per minute is the goal   Add 1000 iu of vitamin D daily for bone health in addition to the multi vitamin

## 2020-02-17 NOTE — Assessment & Plan Note (Signed)
Eating poorly lately  A1C today disc imp of low glycemic diet and wt loss to prevent DM2

## 2020-02-17 NOTE — Assessment & Plan Note (Signed)
Labs today Has been controlled with simvastatin and diet- but diet worse lately Disc goals for lipids and reasons to control them Rev last labs with pt Rev low sat fat diet in detail d

## 2020-02-17 NOTE — Progress Notes (Signed)
Subjective:    Patient ID: Angela Petty, female    DOB: [birth year], 59 y.o.   MRN: [id number]  This visit occurred during the SARS-CoV-2 public health emergency.  Safety protocols were in place, including screening questions prior to the visit, additional usage of staff PPE, and extensive cleaning of exam room while observing appropriate contact time as indicated for disinfecting solutions.    HPI Here for health maintenance exam and to review chronic medical problems    Wt Readings from Last 3 Encounters:  02/17/20 179 lb 6 oz (81.4 kg)  11/13/18 169 lb 8 oz (76.9 kg)  06/10/18 168 lb 12 oz (76.5 kg)  was less active during the pandemic  Loves junk food /ice cream  26.49 kg/m   She is starting to cut back the junk food  Now getting more produce   Exercise is limited due to myofascial pain  Can walk about 30 minutes (incline matters)  Has not started that yet  No indoor exercise    Feeling about the same   Smoking status - smokes 1/4 ppd  Has thoughts of quitting  Not ready to set a quit date  No breathing problems or cough   In the past declines most health mt/cancer screening  Still does   Colon cancer screen -declines  Gyn exam- declines  Breast cancer screen -declines    Flu shot 11/20 covid imm- wants to schedule  Tdap 12/13 pna vaccine 1/20   shingrix- unsure if interested / considering  Has had shingles    BP Readings from Last 3 Encounters:  02/17/20 122/78  11/13/18 130/82  06/10/18 126/70   Pulse Readings from Last 3 Encounters:  02/17/20 (!) 112  11/13/18 (!) 106  06/10/18 (!) 103  mask makes her hot and makes her heart rate go up   Never checks pulse at home  No palpitations but knows it is high at times   Mother had a stroke in the past (had a loop recorder)  Is doing ok    GERD -takes omeprazole 20 mg  No problems   No falls this year  She takes mvi    H/o depression  Takes sertraline  Doing fine -no problems     Myofascial pain disorder /chronic fatigue  Neck has bothered her more   (more knee /hip pain as well)  Takes cyclobenzaprine Tramadol bid prn  (last refill was 60 pills on 3/21) Rev [facility] and no red flags  H/o elevated glucose Lab Results  Component Value Date   HGBA1C 5.6 11/13/2018   Due for labs today   Is taking her K   Hyperlipidemia Lab Results  Component Value Date   CHOL 193 11/13/2018   HDL 53.10 11/13/2018   LDLCALC 108 (H) 11/13/2018   LDLDIRECT 130.8 11/11/2013   TRIG 161.0 (H) 11/13/2018   CHOLHDL 4 11/13/2018   Due for labs today  Takes simvastatin 20 mg  Diet has not been perfect- is getting better   Patient Active Problem List   Diagnosis Date Noted  . Routine general medical examination at a health care facility 11/13/2018  . Hoarseness 06/10/2018  . GERD (gastroesophageal reflux disease) 06/10/2018  . Elevated glucose 03/04/2017  . Primary osteoarthritis of first carpometacarpal joint of left hand 02/22/2016  . BPV (benign positional vertigo) 08/27/2015  . HYPOKALEMIA 12/20/2009  . Hyperlipidemia 11/30/2009  . IBS 11/30/2009  . TOBACCO USE 08/26/2008  . Depression 08/26/2008  . Fibromyalgia 08/26/2008  . Chronic fatigue  08/26/2008   Past Medical History:  Diagnosis Date  . Carpal tunnel syndrome   . Depression   . Fibromyalgia   . GERD (gastroesophageal reflux disease)   . Hyperlipidemia   . Hypokalemia   . 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 Tobacco abuse    Past Surgical History:  Procedure Laterality Date  . HIP FRACTURE SURGERY  1995   left  . LEG SURGERY  age 39   leg surgery with screws placed   Social History   Tobacco Use  . Smoking status: Current Every Day Smoker    Packs/day: 0.25    Types: Cigarettes  . Smokeless tobacco: Never Used  Substance Use Topics  . Alcohol use: No    Alcohol/week: 0.0 standard drinks  . Drug use: Yes    Types: Marijuana    Comment: said uses it for pain   Family History   Problem Relation Age of Onset  . Obesity Father   . Arthritis Father   . Heart disease Paternal Uncle        MI  . Diabetes Maternal Grandmother   . Heart disease Paternal Grandfather        MI  . Cancer Cousin        leukemia   Allergies  Allergen Reactions  . Diclofenac Sodium     REACTION: GI  . Ibuprofen     REACTION: GI  . Naproxen Sodium     REACTION: GI, and did not work  . Rofecoxib     REACTION: GI   Current Outpatient Medications on File Prior to Visit  Medication Sig Dispense Refill  . cyclobenzaprine (FLEXERIL) 10 MG tablet TAKE 1 TABLET BY MOUTH 3 TIMES A DAY AS NEEDED FOR MUSCLE SPASM 270 tablet 2  . meclizine (ANTIVERT) 25 MG tablet TAKE 1 TABLET (25 MG TOTAL) BY MOUTH 3 (THREE) TIMES DAILY AS NEEDED FOR DIZZINESS. 30 tablet 0  . Multiple Vitamin (MULTIVITAMIN) tablet Take 1 tablet by mouth daily.      . Omega-3 Fatty Acids (FISH OIL PO) Take 1 capsule by mouth daily.    . traMADol (ULTRAM) 50 MG tablet TAKE 1 TABLET BY MOUTH TWICE A DAY AS NEEDED FOR PAIN 60 tablet 1   No current facility-administered medications on file prior to visit.    Review of Systems  Constitutional: Negative for activity change, appetite change, fatigue, fever and unexpected weight change.  HENT: Negative for congestion, ear pain, rhinorrhea, sinus pressure and sore throat.   Eyes: Negative for pain, redness and visual disturbance.  Respiratory: Negative for cough, shortness of breath and wheezing.   Cardiovascular: Negative for chest pain and palpitations.  Gastrointestinal: Negative for abdominal pain, blood in stool, constipation and diarrhea.  Endocrine: Negative for polydipsia and polyuria.  Genitourinary: Negative for dysuria, frequency and urgency.  Musculoskeletal: Positive for arthralgias, back pain, myalgias and neck pain. Negative for joint swelling.  Skin: Negative for pallor and rash.  Allergic/Immunologic: Negative for environmental allergies.  Neurological: Negative  for dizziness, syncope and headaches.  Hematological: Negative for adenopathy. Does not bruise/bleed easily.  Psychiatric/Behavioral: Negative for decreased concentration and dysphoric mood. The patient is not nervous/anxious.        Depression is well controlled       Objective:   Physical Exam Constitutional:      General: She is not in acute distress.    Appearance: Normal appearance. She is well-developed and normal weight. She is not ill-appearing or diaphoretic.  HENT:  Head: Normocephalic and atraumatic.     Right Ear: Tympanic membrane, ear canal and external ear normal.     Left Ear: Tympanic membrane, ear canal and external ear normal.     Nose: Nose normal. No congestion.     Mouth/Throat:     Mouth: Mucous membranes are moist.     Pharynx: Oropharynx is clear. No posterior oropharyngeal erythema.  Eyes:     General: No scleral icterus.    Extraocular Movements: Extraocular movements intact.     Conjunctiva/sclera: Conjunctivae normal.     Pupils: Pupils are equal, round, and reactive to light.  Neck:     Thyroid: No thyromegaly.     Vascular: No carotid bruit or JVD.  Cardiovascular:     Rate and Rhythm: Normal rate and regular rhythm.     Pulses: Normal pulses.     Heart sounds: Normal heart sounds. No gallop.   Pulmonary:     Effort: Pulmonary effort is normal. No respiratory distress.     Breath sounds: Normal breath sounds. No wheezing.     Comments: Diffusely distant bs No wheezing Chest:     Chest wall: No tenderness.  Abdominal:     General: Bowel sounds are normal. There is no distension or abdominal bruit.     Palpations: Abdomen is soft. There is no mass.     Tenderness: There is no abdominal tenderness.     Hernia: No hernia is present.  Genitourinary:    Comments: Breast exam: No mass, nodules, thickening, tenderness, bulging, retraction, inflamation, nipple discharge or skin changes noted.  No axillary or clavicular LA.     Musculoskeletal:         General: No tenderness. Normal range of motion.     Cervical back: Normal range of motion and neck supple. No rigidity. No muscular tenderness.     Right lower leg: No edema.     Left lower leg: No edema.     Comments: Myofascial trigger points noted  Lymphadenopathy:     Cervical: No cervical adenopathy.  Skin:    General: Skin is warm and dry.     Coloration: Skin is not pale.     Findings: No erythema or rash.  Neurological:     Mental Status: She is alert. Mental status is at baseline.     Cranial Nerves: No cranial nerve deficit.     Motor: No abnormal muscle tone.     Coordination: Coordination normal.     Gait: Gait normal.     Deep Tendon Reflexes: Reflexes are normal and symmetric.  Psychiatric:        Mood and Affect: Mood normal.           Assessment & Plan:   Problem List Items Addressed This Visit      Digestive   GERD (gastroesophageal reflux disease)    Continues omeprazole with good control       Relevant Medications   omeprazole (PRILOSEC) 20 MG capsule     Other   Hyperlipidemia    Labs today Has been controlled with simvastatin and diet- but diet worse lately Disc goals for lipids and reasons to control them Rev last labs with pt Rev low sat fat diet in detail d      Relevant Medications   simvastatin (ZOCOR) 40 MG tablet   Other Relevant Orders   Lipid panel (Completed)   HYPOKALEMIA   Relevant Orders   Comprehensive metabolic panel (Completed)   TOBACCO USE  Disc in detail risks of smoking and possible outcomes including copd, vascular/ heart disease, cancer , respiratory and sinus infections  Pt voices understanding Pt is considering cessation in the future  Not ready to set quit date      Depression    Continues to benefit from sertraline at 150 mg daily  Reviewed stressors/ coping techniques/symptoms/ support sources/ tx options and side effects in detail today Enc self care and exercise as tolerated       Relevant  Medications   sertraline (ZOLOFT) 100 MG tablet   Fibromyalgia    Myofascial pain continues  Encourage low impact exercise as tolerated   Continues cyclobenzaprine and tramadol prn        Relevant Medications   sertraline (ZOLOFT) 100 MG tablet   Chronic fatigue    Along with chronic myofascial pain  Not clinically changed      Relevant Orders   CBC with Differential/Platelet (Completed)   TSH (Completed)   Elevated glucose    Eating poorly lately  A1C today disc imp of low glycemic diet and wt loss to prevent DM2       Relevant Orders   Hemoglobin A1c (Completed)   Routine general medical examination at a health care facility - Primary    Reviewed health habits including diet and exercise and skin cancer prevention Reviewed appropriate screening tests for age  Also reviewed health mt list, fam hx and immunization status , as well as social and family history   Labs ordered Declines most health mt and cancer screening  Discussed shingrix vaccine  Plans to get covid vaccines first  Counseled re: smoking cessation  Also home exercise options for low impact        Relevant Orders   CBC with Differential/Platelet (Completed)   Comprehensive metabolic panel (Completed)   Lipid panel (Completed)   TSH (Completed)

## 2020-02-17 NOTE — Assessment & Plan Note (Signed)
Disc in detail risks of smoking and possible outcomes including copd, vascular/ heart disease, cancer , respiratory and sinus infections  Pt voices understanding Pt is considering cessation in the future  Not ready to set quit date

## 2020-02-17 NOTE — Assessment & Plan Note (Signed)
Reviewed health habits including diet and exercise and skin cancer prevention Reviewed appropriate screening tests for age  Also reviewed health mt list, fam hx and immunization status , as well as social and family history   Labs ordered Declines most health mt and cancer screening  Discussed shingrix vaccine  Plans to get covid vaccines first  Counseled re: smoking cessation  Also home exercise options for low impact

## 2020-02-17 NOTE — Assessment & Plan Note (Signed)
Continues to benefit from sertraline at 150 mg daily  Reviewed stressors/ coping techniques/symptoms/ support sources/ tx options and side effects in detail today Enc self care and exercise as tolerated

## 2020-02-23 ENCOUNTER — Other Ambulatory Visit: Payer: Self-pay

## 2020-02-23 MED ORDER — MECLIZINE HCL 25 MG PO TABS: 25.0000 mg | ORAL_TABLET | Freq: Three times a day (TID) | ORAL | 0 refills | Status: DC | PRN

## 2020-02-23 NOTE — Telephone Encounter (Signed)
Pt left v/m requesting refill on meclizine.

## 2020-02-23 NOTE — Telephone Encounter (Signed)
CPE was on 02/17/20, last filled on 11/16/17 #30 tabs with 0 refills   [facility]

## 2020-02-25 ENCOUNTER — Other Ambulatory Visit: Payer: Self-pay | Admitting: Family Medicine

## 2020-02-26 NOTE — Telephone Encounter (Signed)
Name of Medication:Tramadol Name of Pharmacy:[facility] [address] [address] Dana or Written Date and Quantity:12/26/19 #60 tabs1refill Last Office Visit and Type:CPE on 02/17/20 Next Office Visit and Type:none scheduled  Last Controlled Substance Agreement Date:11/11/13 Last UDS:11/11/13

## 2020-03-21 ENCOUNTER — Other Ambulatory Visit: Payer: Self-pay | Admitting: Family Medicine

## 2020-06-24 ENCOUNTER — Other Ambulatory Visit: Payer: Self-pay | Admitting: Family Medicine

## 2020-06-25 NOTE — Telephone Encounter (Signed)
Is that too early ?

## 2020-06-25 NOTE — Telephone Encounter (Signed)
Last filled on 11/24/19 #270 tabs with 2 refills, CPE was on 02/17/20

## 2020-08-11 ENCOUNTER — Other Ambulatory Visit: Payer: Self-pay | Admitting: Family Medicine

## 2020-08-11 NOTE — Telephone Encounter (Signed)
Name of Medication:Tramadol Name of Pharmacy:[facility] [address] Lavonia Dana or Written Date and Quantity:02/26/20 #60 tabs3refill Last Office Visit and Type:CPE on 02/17/20 Next Office Visit and Type:none scheduled  Last Controlled Substance Agreement Date:11/11/13 Last UDS:11/11/13

## 2020-08-23 ENCOUNTER — Other Ambulatory Visit: Payer: Self-pay | Admitting: Family Medicine

## 2020-08-24 NOTE — Telephone Encounter (Signed)
Last OV 02/17/20 Last fill 11/24/19  #270/2 Please see message and advise.  Thank you.

## 2020-09-14 DIAGNOSIS — M67911 Unspecified disorder of synovium and tendon, right shoulder: Secondary | ICD-10-CM | POA: Diagnosis not present

## 2020-09-14 DIAGNOSIS — M75111 Incomplete rotator cuff tear or rupture of right shoulder, not specified as traumatic: Secondary | ICD-10-CM | POA: Diagnosis not present

## 2020-09-21 DIAGNOSIS — M25511 Pain in right shoulder: Secondary | ICD-10-CM | POA: Diagnosis not present

## 2020-09-23 DIAGNOSIS — Z23 Encounter for immunization: Secondary | ICD-10-CM | POA: Diagnosis not present

## 2020-09-27 DIAGNOSIS — M75121 Complete rotator cuff tear or rupture of right shoulder, not specified as traumatic: Secondary | ICD-10-CM | POA: Diagnosis not present

## 2020-10-14 DIAGNOSIS — M7541 Impingement syndrome of right shoulder: Secondary | ICD-10-CM | POA: Diagnosis not present

## 2020-10-14 DIAGNOSIS — G8918 Other acute postprocedural pain: Secondary | ICD-10-CM | POA: Diagnosis not present

## 2020-10-14 DIAGNOSIS — M24111 Other articular cartilage disorders, right shoulder: Secondary | ICD-10-CM | POA: Diagnosis not present

## 2020-10-14 DIAGNOSIS — M75121 Complete rotator cuff tear or rupture of right shoulder, not specified as traumatic: Secondary | ICD-10-CM | POA: Diagnosis not present

## 2020-11-02 DIAGNOSIS — M25611 Stiffness of right shoulder, not elsewhere classified: Secondary | ICD-10-CM | POA: Diagnosis not present

## 2020-11-02 DIAGNOSIS — S46011D Strain of muscle(s) and tendon(s) of the rotator cuff of right shoulder, subsequent encounter: Secondary | ICD-10-CM | POA: Diagnosis not present

## 2020-11-09 DIAGNOSIS — S46011D Strain of muscle(s) and tendon(s) of the rotator cuff of right shoulder, subsequent encounter: Secondary | ICD-10-CM | POA: Diagnosis not present

## 2020-11-09 DIAGNOSIS — M25611 Stiffness of right shoulder, not elsewhere classified: Secondary | ICD-10-CM | POA: Diagnosis not present

## 2020-11-16 DIAGNOSIS — S46011D Strain of muscle(s) and tendon(s) of the rotator cuff of right shoulder, subsequent encounter: Secondary | ICD-10-CM | POA: Diagnosis not present

## 2020-11-16 DIAGNOSIS — M25611 Stiffness of right shoulder, not elsewhere classified: Secondary | ICD-10-CM | POA: Diagnosis not present

## 2021-01-21 ENCOUNTER — Other Ambulatory Visit: Payer: Self-pay | Admitting: Family Medicine

## 2021-01-21 NOTE — Telephone Encounter (Signed)
I refilled  Please schedule annual exam May or June

## 2021-01-21 NOTE — Telephone Encounter (Signed)
Name of Medication:Tramadol Name of Pharmacy:[facility] [address] Lavonia Dana or Written Date and Quantity:08/11/20#60 tabs3refill Last Office Visit and Type:CPE on 02/17/20 Next Office Visit and Type:none scheduled Last Controlled Substance Agreement Date:11/11/13 Last UDS:11/11/13

## 2021-01-24 NOTE — Telephone Encounter (Signed)
Carrie scheduled CPE with pt

## 2021-02-12 ENCOUNTER — Other Ambulatory Visit: Payer: Self-pay | Admitting: Family Medicine

## 2021-02-15 ENCOUNTER — Other Ambulatory Visit: Payer: Self-pay | Admitting: Family Medicine

## 2021-02-16 NOTE — Telephone Encounter (Signed)
CPE scheduled 04/14/21, last filled on 08/24/20 #270 tabs with 1 refill

## 2021-02-28 DIAGNOSIS — Z09 Encounter for follow-up examination after completed treatment for conditions other than malignant neoplasm: Secondary | ICD-10-CM | POA: Diagnosis not present

## 2021-03-02 ENCOUNTER — Other Ambulatory Visit: Payer: Self-pay | Admitting: Family Medicine

## 2021-04-06 ENCOUNTER — Telehealth: Payer: Self-pay | Admitting: Family Medicine

## 2021-04-06 DIAGNOSIS — R7309 Other abnormal glucose: Secondary | ICD-10-CM

## 2021-04-06 DIAGNOSIS — E7800 Pure hypercholesterolemia, unspecified: Secondary | ICD-10-CM

## 2021-04-06 DIAGNOSIS — Z0000 Encounter for general adult medical examination without abnormal findings: Secondary | ICD-10-CM

## 2021-04-06 NOTE — Telephone Encounter (Signed)
-----   Message from Aquilla Solian, RT sent at 03/21/2021  2:20 PM EDT ----- Regarding: Lab Orders for Thursday 6.2.2022 Please place lab orders for Thursday 6.2.2022, office visit for physical on Thursday 6.9.2022 Thank you, Jones Bales RT(R)

## 2021-04-07 ENCOUNTER — Other Ambulatory Visit (INDEPENDENT_AMBULATORY_CARE_PROVIDER_SITE_OTHER): Payer: Federal, State, Local not specified - PPO

## 2021-04-07 ENCOUNTER — Other Ambulatory Visit: Payer: Self-pay

## 2021-04-07 DIAGNOSIS — E7800 Pure hypercholesterolemia, unspecified: Secondary | ICD-10-CM

## 2021-04-07 DIAGNOSIS — R7309 Other abnormal glucose: Secondary | ICD-10-CM | POA: Diagnosis not present

## 2021-04-07 DIAGNOSIS — Z0000 Encounter for general adult medical examination without abnormal findings: Secondary | ICD-10-CM

## 2021-04-07 LAB — CBC WITH DIFFERENTIAL/PLATELET
Basophils Absolute: 0 10*3/uL (ref 0.0–0.1)
Basophils Relative: 0.3 % (ref 0.0–3.0)
Eosinophils Absolute: 0.4 10*3/uL (ref 0.0–0.7)
Eosinophils Relative: 4.4 % (ref 0.0–5.0)
HCT: 43.3 % (ref 36.0–46.0)
Hemoglobin: 14.7 g/dL (ref 12.0–15.0)
Lymphocytes Relative: 25.1 % (ref 12.0–46.0)
Lymphs Abs: 2.1 10*3/uL (ref 0.7–4.0)
MCHC: 33.8 g/dL (ref 30.0–36.0)
MCV: 88.7 fl (ref 78.0–100.0)
Monocytes Absolute: 0.7 10*3/uL (ref 0.1–1.0)
Monocytes Relative: 8.1 % (ref 3.0–12.0)
Neutro Abs: 5.3 10*3/uL (ref 1.4–7.7)
Neutrophils Relative %: 62.1 % (ref 43.0–77.0)
Platelets: 257 10*3/uL (ref 150.0–400.0)
RBC: 4.89 Mil/uL (ref 3.87–5.11)
RDW: 13.3 % (ref 11.5–15.5)
WBC: 8.5 10*3/uL (ref 4.0–10.5)

## 2021-04-07 LAB — TSH: TSH: 3.69 u[IU]/mL (ref 0.35–4.50)

## 2021-04-07 LAB — HEMOGLOBIN A1C: Hgb A1c MFr Bld: 5.9 % (ref 4.6–6.5)

## 2021-04-08 LAB — COMPREHENSIVE METABOLIC PANEL
ALT: 24 U/L (ref 0–35)
AST: 22 U/L (ref 0–37)
Albumin: 4.6 g/dL (ref 3.5–5.2)
Alkaline Phosphatase: 96 U/L (ref 39–117)
BUN: 13 mg/dL (ref 6–23)
CO2: 26 mEq/L (ref 19–32)
Calcium: 9.5 mg/dL (ref 8.4–10.5)
Chloride: 104 mEq/L (ref 96–112)
Creatinine, Ser: 0.84 mg/dL (ref 0.40–1.20)
GFR: 76.78 mL/min (ref >60.00)
Glucose, Bld: 117 mg/dL — ABNORMAL HIGH (ref 70–99)
Potassium: 4.4 mEq/L (ref 3.5–5.1)
Sodium: 142 mEq/L (ref 135–145)
Total Bilirubin: 0.4 mg/dL (ref 0.2–1.2)
Total Protein: 6.9 g/dL (ref 6.0–8.3)

## 2021-04-08 LAB — LIPID PANEL
Cholesterol: 193 mg/dL (ref 0–200)
HDL: 49.8 mg/dL (ref >39.00)
LDL Cholesterol: 117 mg/dL — ABNORMAL HIGH (ref 0–99)
NonHDL: 143.45
Total CHOL/HDL Ratio: 4
Triglycerides: 134 mg/dL (ref 0.0–149.0)
VLDL: 26.8 mg/dL (ref 0.0–40.0)

## 2021-04-14 ENCOUNTER — Ambulatory Visit (INDEPENDENT_AMBULATORY_CARE_PROVIDER_SITE_OTHER): Payer: Federal, State, Local not specified - PPO | Admitting: Family Medicine

## 2021-04-14 ENCOUNTER — Encounter: Payer: Self-pay | Admitting: Family Medicine

## 2021-04-14 ENCOUNTER — Other Ambulatory Visit: Payer: Self-pay

## 2021-04-14 VITALS — BP 122/70 | HR 92 | Temp 96.9°F | Ht 69.0 in | Wt 177.5 lb

## 2021-04-14 DIAGNOSIS — Z0000 Encounter for general adult medical examination without abnormal findings: Secondary | ICD-10-CM | POA: Diagnosis not present

## 2021-04-14 DIAGNOSIS — K219 Gastro-esophageal reflux disease without esophagitis: Secondary | ICD-10-CM

## 2021-04-14 DIAGNOSIS — E7800 Pure hypercholesterolemia, unspecified: Secondary | ICD-10-CM

## 2021-04-14 DIAGNOSIS — F17200 Nicotine dependence, unspecified, uncomplicated: Secondary | ICD-10-CM

## 2021-04-14 DIAGNOSIS — M797 Fibromyalgia: Secondary | ICD-10-CM

## 2021-04-14 DIAGNOSIS — F339 Major depressive disorder, recurrent, unspecified: Secondary | ICD-10-CM

## 2021-04-14 DIAGNOSIS — K580 Irritable bowel syndrome with diarrhea: Secondary | ICD-10-CM

## 2021-04-14 DIAGNOSIS — R7309 Other abnormal glucose: Secondary | ICD-10-CM | POA: Diagnosis not present

## 2021-04-14 MED ORDER — TRAMADOL HCL 50 MG PO TABS: 50.0000 mg | ORAL_TABLET | Freq: Two times a day (BID) | ORAL | 1 refills | Status: DC | PRN

## 2021-04-14 MED ORDER — SERTRALINE HCL 100 MG PO TABS: ORAL_TABLET | ORAL | 3 refills | Status: DC

## 2021-04-14 MED ORDER — POTASSIUM CHLORIDE ER 10 MEQ PO CPCR: 20.0000 meq | ORAL_CAPSULE | Freq: Every day | ORAL | 0 refills | Status: DC

## 2021-04-14 MED ORDER — OMEPRAZOLE 20 MG PO CPDR: DELAYED_RELEASE_CAPSULE | ORAL | 3 refills | Status: DC

## 2021-04-14 MED ORDER — SIMVASTATIN 20 MG PO TABS: ORAL_TABLET | ORAL | 3 refills | Status: DC

## 2021-04-14 NOTE — Assessment & Plan Note (Signed)
Continues tramadol prn and flexeril  Overall stable  Disc exercise options like bike or water program in the future to help mobility

## 2021-04-14 NOTE — Patient Instructions (Addendum)
If you are interested in the shingles vaccine series (Shingrix), call your insurance or pharmacy to check on coverage and location it must be given.  If affordable - you can schedule it here or at your pharmacy depending on coverage   If you change your mind about cancer screening let us know   Take care of yourself  Consider exercise bike or water exercise   To prevent diabetes Try to get most of your carbohydrates from produce (with the exception of white potatoes)  Eat less bread/pasta/rice/snack foods/cereals/sweets and other items from the middle of the grocery store (processed carbs)

## 2021-04-14 NOTE — Assessment & Plan Note (Signed)
Lab Results  Component Value Date   HGBA1C 5.9 04/07/2021   disc imp of low glycemic diet and wt loss to prevent DM2

## 2021-04-14 NOTE — Assessment & Plan Note (Signed)
Continues omeprazole 20 mg daily  Enc her to watch diet also

## 2021-04-14 NOTE — Assessment & Plan Note (Signed)
Disc goals for lipids and reasons to control them Rev last labs with pt Rev low sat fat diet in detail  Plan to continue simvastatin 20 mg daily  LDL is down at 117

## 2021-04-14 NOTE — Assessment & Plan Note (Signed)
Stable with sertraline 150 mg daily  Reviewed stressors/ coping techniques/symptoms/ support sources/ tx options and side effects in detail today  Encouraged good self care and exercise as tolerated

## 2021-04-14 NOTE — Assessment & Plan Note (Signed)
Reviewed health habits including diet and exercise and skin cancer prevention Reviewed appropriate screening tests for age  Also reviewed health mt list, fam hx and immunization status , as well as social and family history   Pt declines colon and breast and lung cancer screening  Declines gyn exam/ reports no problems or issues  May be interested in shingrix vaccine if it is covered  Is covid vaccinated with booster  Counseled on smoking cessation  No falls /fx -should consider dexa

## 2021-04-14 NOTE — Progress Notes (Signed)
Subjective:    Patient ID: Angela Petty, female    DOB: 10/12/1961, 60 y.o.   MRN: [id number]  This visit occurred during the SARS-CoV-2 public health emergency.  Safety protocols were in place, including screening questions prior to the visit, additional usage of staff PPE, and extensive cleaning of exam room while observing appropriate contact time as indicated for disinfecting solutions.   HPI Here for health maintenance exam and to review chronic medical problems    Wt Readings from Last 3 Encounters:  04/14/21 177 lb 8 oz (80.5 kg)  02/17/20 179 lb 6 oz (81.4 kg)  11/13/18 169 lb 8 oz (76.9 kg)   26.21 kg/m  Not doing a lot lately  Feeling ok in general   Colon cancer screening -declines  Breast cancer screening -declines  Self breast exam   Gyn care -not interested No issues    Zoster status - may be interested in shingrix  Flu shot -in the fall  Pna vaccine 2020 Tdap 12/13 Covid vaccinated with booster   Smoking status - would love to quit  The habit is the hardest part  Has never quit before  Notices some breathing changes / a little congestion in throat and cough  1/2 ppd or less 40 years or so  Not interested in lung cancer screening    GERD-takes omeprazole 20 mg daily  Some loose stool for the past week  Funny taste in her mouth No abd pain  Has had IBS in the past  Stress level is fairly low  May be from increase fruit     Lab Results  Component Value Date   CREATININE 0.84 04/07/2021   BUN 13 04/07/2021   NA 142 04/07/2021   K 4.4 04/07/2021   CL 104 04/07/2021   CO2 26 04/07/2021    History of depression in the setting of fibromyalgia and chronic fatigue  Sertraline 150 mg daily  Mood is stable/pretty good   Takes tramadol and flexeril for chronic pain    H/o elevated glucose Lab Results  Component Value Date   HGBA1C 5.9 04/07/2021  Fasting glucose of 117  Hyperlipidemia Lab Results  Component Value Date   CHOL 193  04/07/2021   CHOL 196 02/17/2020   CHOL 193 11/13/2018   Lab Results  Component Value Date   HDL 49.80 04/07/2021   HDL 50.70 02/17/2020   HDL 53.10 11/13/2018   Lab Results  Component Value Date   LDLCALC 117 (H) 04/07/2021   LDLCALC 126 (H) 02/17/2020   LDLCALC 108 (H) 11/13/2018   Lab Results  Component Value Date   TRIG 134.0 04/07/2021   TRIG 97.0 02/17/2020   TRIG 161.0 (H) 11/13/2018   Lab Results  Component Value Date   CHOLHDL 4 04/07/2021   CHOLHDL 4 02/17/2020   CHOLHDL 4 11/13/2018   Lab Results  Component Value Date   LDLDIRECT 130.8 11/11/2013   LDLDIRECT 142.9 10/06/2013   LDLDIRECT 134.9 10/18/2011   Taking simvastatin 20 mg daily  Diet is fair   Other labs Lab Results  Component Value Date   WBC 8.5 04/07/2021   HGB 14.7 04/07/2021   HCT 43.3 04/07/2021   MCV 88.7 04/07/2021   PLT 257.0 04/07/2021   Lab Results  Component Value Date   TSH 3.69 04/07/2021    Lab Results  Component Value Date   ALT 24 04/07/2021   AST 22 04/07/2021   ALKPHOS 96 04/07/2021   BILITOT 0.4 04/07/2021  Patient Active Problem List   Diagnosis Date Noted   Routine general medical examination at a health care facility 11/13/2018   GERD (gastroesophageal reflux disease) 06/10/2018   Elevated glucose 03/04/2017   Primary osteoarthritis of first carpometacarpal joint of left hand 02/22/2016   BPV (benign positional vertigo) 08/27/2015   HYPOKALEMIA 12/20/2009   Hyperlipidemia 11/30/2009   IBS 11/30/2009   TOBACCO USE 08/26/2008   Depression 08/26/2008   Fibromyalgia 08/26/2008   Chronic fatigue 08/26/2008   Past Medical History:  Diagnosis Date   Carpal tunnel syndrome    Depression    Fibromyalgia    GERD (gastroesophageal reflux disease)    Hyperlipidemia    Hypokalemia    Pyelonephritis 2006   hsopoitalized   Tobacco abuse    Past Surgical History:  Procedure Laterality Date   HIP FRACTURE SURGERY  1995   left   LEG SURGERY  age 29    leg surgery with screws placed   Social History   Tobacco Use   Smoking status: Every Day    Packs/day: 0.25    Pack years: 0.00    Types: Cigarettes   Smokeless tobacco: Never  Substance Use Topics   Alcohol use: No    Alcohol/week: 0.0 standard drinks   Drug use: Yes    Types: Marijuana    Comment: said uses it for pain   Family History  Problem Relation Age of Onset   Obesity Father    Arthritis Father    Heart disease Paternal Uncle        MI   Diabetes Maternal Grandmother    Heart disease Paternal Grandfather        MI   Cancer Cousin        leukemia   Allergies  Allergen Reactions   Diclofenac Sodium     REACTION: GI   Ibuprofen     REACTION: GI   Naproxen Sodium     REACTION: GI, and did not work   Rofecoxib     REACTION: GI   Current Outpatient Medications on File Prior to Visit  Medication Sig Dispense Refill   cyclobenzaprine (FLEXERIL) 10 MG tablet TAKE 1 TABLET BY MOUTH 3 TIMES A DAY AS NEEDED FOR MUSCLE SPASMS 270 tablet 1   meclizine (ANTIVERT) 25 MG tablet Take 1 tablet (25 mg total) by mouth 3 (three) times daily as needed for dizziness. 30 tablet 0   Multiple Vitamin (MULTIVITAMIN) tablet Take 1 tablet by mouth daily.       Omega-3 Fatty Acids (FISH OIL PO) Take 1 capsule by mouth daily.     No current facility-administered medications on file prior to visit.    Review of Systems  Constitutional:  Positive for fatigue. Negative for activity change, appetite change, fever and unexpected weight change.  HENT:  Negative for congestion, ear pain, rhinorrhea, sinus pressure and sore throat.   Eyes:  Negative for pain, redness and visual disturbance.  Respiratory:  Negative for cough, shortness of breath and wheezing.   Cardiovascular:  Negative for chest pain and palpitations.  Gastrointestinal:  Negative for abdominal pain, blood in stool, constipation and diarrhea.  Endocrine: Negative for polydipsia and polyuria.  Genitourinary:  Negative for  dysuria, frequency and urgency.  Musculoskeletal:  Positive for arthralgias and myalgias. Negative for back pain and joint swelling.  Skin:  Negative for pallor and rash.  Allergic/Immunologic: Negative for environmental allergies.  Neurological:  Negative for dizziness, syncope and headaches.  Hematological:  Negative for adenopathy.  Does not bruise/bleed easily.  Psychiatric/Behavioral:  Negative for decreased concentration and dysphoric mood. The patient is not nervous/anxious.        Depression is fairly well controlled       Objective:   Physical Exam Constitutional:      General: She is not in acute distress.    Appearance: Normal appearance. She is well-developed and normal weight. She is not ill-appearing or diaphoretic.  HENT:     Head: Normocephalic and atraumatic.     Right Ear: Tympanic membrane, ear canal and external ear normal.     Left Ear: Tympanic membrane, ear canal and external ear normal.     Nose: Nose normal. No congestion.     Mouth/Throat:     Mouth: Mucous membranes are moist.     Pharynx: Oropharynx is clear. No posterior oropharyngeal erythema.  Eyes:     General: No scleral icterus.    Extraocular Movements: Extraocular movements intact.     Conjunctiva/sclera: Conjunctivae normal.     Pupils: Pupils are equal, round, and reactive to light.  Neck:     Thyroid: No thyromegaly.     Vascular: No carotid bruit or JVD.  Cardiovascular:     Rate and Rhythm: Normal rate and regular rhythm.     Pulses: Normal pulses.     Heart sounds: Normal heart sounds.    No gallop.  Pulmonary:     Effort: Pulmonary effort is normal. No respiratory distress.     Breath sounds: Normal breath sounds. No wheezing.     Comments: Good air exch Chest:     Chest wall: No tenderness.  Abdominal:     General: Bowel sounds are normal. There is no distension or abdominal bruit.     Palpations: Abdomen is soft. There is no mass.     Tenderness: There is no abdominal  tenderness.     Hernia: No hernia is present.  Genitourinary:    Comments: Breast exam: No mass, nodules, thickening, tenderness, bulging, retraction, inflamation, nipple discharge or skin changes noted.  No axillary or clavicular LA.     Musculoskeletal:        General: No tenderness. Normal range of motion.     Cervical back: Normal range of motion and neck supple. No rigidity. No muscular tenderness.     Right lower leg: No edema.     Left lower leg: No edema.     Comments: No kyphosis   Lymphadenopathy:     Cervical: No cervical adenopathy.  Skin:    General: Skin is warm and dry.     Coloration: Skin is not pale.     Findings: No erythema or rash.     Comments: Solar lentigines diffusely Few sks  Neurological:     Mental Status: She is alert. Mental status is at baseline.     Cranial Nerves: No cranial nerve deficit.     Motor: No abnormal muscle tone.     Coordination: Coordination normal.     Gait: Gait normal.     Deep Tendon Reflexes: Reflexes are normal and symmetric. Reflexes normal.  Psychiatric:        Mood and Affect: Mood normal.        Cognition and Memory: Cognition and memory normal.          Assessment & Plan:   Problem List Items Addressed This Visit       Digestive   IBS   Relevant Medications   omeprazole (PRILOSEC) 20  MG capsule   GERD (gastroesophageal reflux disease)    Continues omeprazole 20 mg daily  Enc her to watch diet also       Relevant Medications   omeprazole (PRILOSEC) 20 MG capsule     Other   Hyperlipidemia    Disc goals for lipids and reasons to control them Rev last labs with pt Rev low sat fat diet in detail  Plan to continue simvastatin 20 mg daily  LDL is down at 117        Relevant Medications   simvastatin (ZOCOR) 20 MG tablet   TOBACCO USE   Depression    Stable with sertraline 150 mg daily  Reviewed stressors/ coping techniques/symptoms/ support sources/ tx options and side effects in detail today   Encouraged good self care and exercise as tolerated        Relevant Medications   sertraline (ZOLOFT) 100 MG tablet   Fibromyalgia    Continues tramadol prn and flexeril  Overall stable  Disc exercise options like bike or water program in the future to help mobility       Relevant Medications   traMADol (ULTRAM) 50 MG tablet   sertraline (ZOLOFT) 100 MG tablet   Elevated glucose    Lab Results  Component Value Date   HGBA1C 5.9 04/07/2021  disc imp of low glycemic diet and wt loss to prevent DM2        Routine general medical examination at a health care facility - Primary    Reviewed health habits including diet and exercise and skin cancer prevention Reviewed appropriate screening tests for age  Also reviewed health mt list, fam hx and immunization status , as well as social and family history   Pt declines colon and breast and lung cancer screening  Declines gyn exam/ reports no problems or issues  May be interested in shingrix vaccine if it is covered  Is covid vaccinated with booster  Counseled on smoking cessation  No falls /fx -should consider dexa

## 2021-06-17 ENCOUNTER — Other Ambulatory Visit: Payer: Self-pay | Admitting: Family Medicine

## 2021-06-17 NOTE — Telephone Encounter (Signed)
Name of Medication: Tramadol Name of Pharmacy: [facility] Last [place] or Written Date and Quantity: 04/14/21 #60 tab/1 refil Last Office Visit and Type: CPE on 04/14/21 Next Office Visit and Type: None Scheduled   Flexeril last filled on 02/16/21 #270 with 1 refill (? If to soon)

## 2021-06-17 NOTE — Telephone Encounter (Signed)
Flexeril was too soon Thanks

## 2021-07-05 ENCOUNTER — Encounter: Payer: Self-pay | Admitting: Family Medicine

## 2021-07-05 ENCOUNTER — Telehealth: Payer: Self-pay

## 2021-07-05 ENCOUNTER — Ambulatory Visit: Payer: Federal, State, Local not specified - PPO | Admitting: Family Medicine

## 2021-07-05 ENCOUNTER — Other Ambulatory Visit: Payer: Self-pay

## 2021-07-05 DIAGNOSIS — F418 Other specified anxiety disorders: Secondary | ICD-10-CM

## 2021-07-05 DIAGNOSIS — T6593XA Toxic effect of unspecified substance, assault, initial encounter: Secondary | ICD-10-CM

## 2021-07-05 MED ORDER — ALPRAZOLAM 0.5 MG PO TABS: 0.5000 mg | ORAL_TABLET | Freq: Two times a day (BID) | ORAL | 0 refills | Status: DC | PRN
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
## 2021-07-05 NOTE — Telephone Encounter (Signed)
I will see her then  ER precautions noted

## 2021-07-05 NOTE — Patient Instructions (Addendum)
Drink lots of fluids   Get some plain saline (re wetting , moisturizing eye drops) and use as needed  If vision changes let us know  Clean affected areas and launder clothes   Take care of yourself   Throat looks ok , lungs sound ok  Some of the shaking is post traumatic , some possibly chemical   If you start wheezing or if you have swelling of tongue /mouth or trouble breathing go to the ER and alert [place]   If more severe headache or neurologic symptoms let us know   Keep [place] posted
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
## 2021-07-12 ENCOUNTER — Telehealth: Payer: Self-pay | Admitting: Family Medicine
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
## 2021-07-15 NOTE — Telephone Encounter (Signed)
Too early for the flexeril?  Can refill meclizine for 30

## 2021-07-25 ENCOUNTER — Other Ambulatory Visit: Payer: Self-pay | Admitting: Family Medicine
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
## 2021-08-02 ENCOUNTER — Other Ambulatory Visit: Payer: Self-pay | Admitting: Family Medicine
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
## 2021-08-03 NOTE — Telephone Encounter (Signed)
Too early

## 2021-08-09 MED ORDER — CYCLOBENZAPRINE HCL 10 MG PO TABS: ORAL_TABLET | ORAL | 0 refills | Status: DC

## 2021-08-09 NOTE — Telephone Encounter (Signed)
Flexeril last filled on 02/16/21 #270 tabs with 1 refill  [facility] [address]

## 2021-08-09 NOTE — Telephone Encounter (Signed)
Pt called stating that she have been taking the right dosage for the flexeril and she doesn't have any pills left

## 2021-08-09 NOTE — Addendum Note (Signed)
Addended by: Roxy Manns A on: 08/09/2021 05:01 PM   Modules accepted: Orders

## 2021-08-09 NOTE — Telephone Encounter (Signed)
I sent it  

## 2021-08-24 ENCOUNTER — Other Ambulatory Visit: Payer: Self-pay | Admitting: Family Medicine
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
## 2021-10-10 ENCOUNTER — Other Ambulatory Visit: Payer: Self-pay | Admitting: Family Medicine
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
## 2021-11-01 ENCOUNTER — Other Ambulatory Visit: Payer: Self-pay | Admitting: Family Medicine
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
## 2021-12-02 ENCOUNTER — Other Ambulatory Visit: Payer: Self-pay | Admitting: Family Medicine
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
## 2022-01-17 ENCOUNTER — Other Ambulatory Visit: Payer: Self-pay | Admitting: Family Medicine
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
## 2022-02-01 ENCOUNTER — Other Ambulatory Visit: Payer: Self-pay | Admitting: Family Medicine
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
## 2022-02-18 ENCOUNTER — Other Ambulatory Visit: Payer: Self-pay | Admitting: Family Medicine

## 2022-02-20 ENCOUNTER — Other Ambulatory Visit: Payer: Self-pay | Admitting: Family Medicine
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
## 2022-02-21 NOTE — Telephone Encounter (Signed)
Please schedule f/u or PE (her preference) this summer  ? ?

## 2022-02-28 NOTE — Telephone Encounter (Signed)
Letter mailed letting pt know ?

## 2022-03-08 DIAGNOSIS — M25512 Pain in left shoulder: Secondary | ICD-10-CM | POA: Diagnosis not present

## 2022-03-23 ENCOUNTER — Telehealth: Payer: Self-pay | Admitting: Family Medicine
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
## 2022-03-30 ENCOUNTER — Other Ambulatory Visit: Payer: Self-pay | Admitting: Family Medicine

## 2022-03-31 MED ORDER — TRAMADOL HCL 50 MG PO TABS: 50.0000 mg | ORAL_TABLET | Freq: Two times a day (BID) | ORAL | 0 refills | Status: DC | PRN

## 2022-03-31 MED ORDER — POTASSIUM CHLORIDE ER 10 MEQ PO CPCR: 20.0000 meq | ORAL_CAPSULE | Freq: Every day | ORAL | 0 refills | Status: DC

## 2022-03-31 NOTE — Addendum Note (Signed)
Addended by: Roxy Manns A on: 03/31/2022 11:41 AM   Modules accepted: Orders

## 2022-03-31 NOTE — Telephone Encounter (Signed)
Pt called back to check on this and pottasium, she scheduled CPE for next available on 04/19/22. Please advise

## 2022-03-31 NOTE — Telephone Encounter (Signed)
I sent them since she made an appt

## 2022-04-19 ENCOUNTER — Encounter: Payer: Self-pay | Admitting: Family Medicine

## 2022-04-19 ENCOUNTER — Ambulatory Visit (INDEPENDENT_AMBULATORY_CARE_PROVIDER_SITE_OTHER): Payer: Federal, State, Local not specified - PPO | Admitting: Family Medicine

## 2022-04-19 VITALS — BP 130/84 | HR 85 | Temp 97.8°F | Resp 16 | Ht 69.0 in | Wt 178.4 lb

## 2022-04-19 DIAGNOSIS — F17200 Nicotine dependence, unspecified, uncomplicated: Secondary | ICD-10-CM | POA: Diagnosis not present

## 2022-04-19 DIAGNOSIS — K219 Gastro-esophageal reflux disease without esophagitis: Secondary | ICD-10-CM

## 2022-04-19 DIAGNOSIS — R7309 Other abnormal glucose: Secondary | ICD-10-CM

## 2022-04-19 DIAGNOSIS — F418 Other specified anxiety disorders: Secondary | ICD-10-CM

## 2022-04-19 DIAGNOSIS — M797 Fibromyalgia: Secondary | ICD-10-CM

## 2022-04-19 DIAGNOSIS — Z0000 Encounter for general adult medical examination without abnormal findings: Secondary | ICD-10-CM

## 2022-04-19 DIAGNOSIS — E7800 Pure hypercholesterolemia, unspecified: Secondary | ICD-10-CM | POA: Diagnosis not present

## 2022-04-19 LAB — CBC WITH DIFFERENTIAL/PLATELET
Basophils Absolute: 0 10*3/uL (ref 0.0–0.1)
Basophils Relative: 0.3 % (ref 0.0–3.0)
Eosinophils Absolute: 0.3 10*3/uL (ref 0.0–0.7)
Eosinophils Relative: 4 % (ref 0.0–5.0)
HCT: 41.8 % (ref 36.0–46.0)
Hemoglobin: 14.2 g/dL (ref 12.0–15.0)
Lymphocytes Relative: 24.5 % (ref 12.0–46.0)
Lymphs Abs: 1.8 10*3/uL (ref 0.7–4.0)
MCHC: 33.9 g/dL (ref 30.0–36.0)
MCV: 89.2 fl (ref 78.0–100.0)
Monocytes Absolute: 0.5 10*3/uL (ref 0.1–1.0)
Monocytes Relative: 7.1 % (ref 3.0–12.0)
Neutro Abs: 4.7 10*3/uL (ref 1.4–7.7)
Neutrophils Relative %: 64.1 % (ref 43.0–77.0)
Platelets: 252 10*3/uL (ref 150.0–400.0)
RBC: 4.68 Mil/uL (ref 3.87–5.11)
RDW: 13.3 % (ref 11.5–15.5)
WBC: 7.3 10*3/uL (ref 4.0–10.5)

## 2022-04-19 LAB — COMPREHENSIVE METABOLIC PANEL
ALT: 32 U/L (ref 0–35)
AST: 25 U/L (ref 0–37)
Albumin: 4.4 g/dL (ref 3.5–5.2)
Alkaline Phosphatase: 81 U/L (ref 39–117)
BUN: 9 mg/dL (ref 6–23)
CO2: 29 mEq/L (ref 19–32)
Calcium: 9.7 mg/dL (ref 8.4–10.5)
Chloride: 104 mEq/L (ref 96–112)
Creatinine, Ser: 0.66 mg/dL (ref 0.40–1.20)
GFR: 96.23 mL/min (ref >60.00)
Glucose, Bld: 107 mg/dL — ABNORMAL HIGH (ref 70–99)
Potassium: 4.4 mEq/L (ref 3.5–5.1)
Sodium: 141 mEq/L (ref 135–145)
Total Bilirubin: 0.4 mg/dL (ref 0.2–1.2)
Total Protein: 6.7 g/dL (ref 6.0–8.3)

## 2022-04-19 LAB — TSH: TSH: 2.31 u[IU]/mL (ref 0.35–5.50)

## 2022-04-19 LAB — LIPID PANEL
Cholesterol: 183 mg/dL (ref 0–200)
HDL: 56.4 mg/dL (ref >39.00)
LDL Cholesterol: 100 mg/dL — ABNORMAL HIGH (ref 0–99)
NonHDL: 127.06
Total CHOL/HDL Ratio: 3
Triglycerides: 136 mg/dL (ref 0.0–149.0)
VLDL: 27.2 mg/dL (ref 0.0–40.0)

## 2022-04-19 LAB — HEMOGLOBIN A1C: Hgb A1c MFr Bld: 5.8 % (ref 4.6–6.5)

## 2022-04-19 NOTE — Patient Instructions (Addendum)
If you want to do a stool screening kit or card let us know   If you want to wean the omeprazole -try taking it every other day  Watch your diet for the things that make it worse    For cholesterol  Avoid red meat/ fried foods/ egg yolks/ fatty breakfast meats/ butter, cheese and high fat dairy/ and shellfish   Use ground [place] instead of ground beef for cooking   If you want to see a cardiologist to discuss your risk factors for heart disease let us know   Goal : Eventually quit smoking Stay as active as you can be  Eat right  Keep cholesterol as low as possible  Watch your blood pressure   If interested in the [place] lung cancer screening program let us know as well

## 2022-04-19 NOTE — Progress Notes (Signed)
Subjective:    Patient ID: Angela FordHolly M Kareem, female    DOB: 02/10/1961, 61 y.o.   MRN: [id number]  HPI Here for health maintenance exam and to review chronic medical problems    Wt Readings from Last 3 Encounters:  04/19/22 178 lb 6 oz (80.9 kg)  07/05/21 175 lb 8 oz (79.6 kg)  04/14/21 177 lb 8 oz (80.5 kg)   26.34 kg/m  Doing ok  Not doing a lot  Has a new puppy -keeping her active     Immunization History  Administered Date(s) Administered   Influenza Split 10/18/2011, 10/08/2012   Influenza Whole 11/30/2009, 10/07/2010   Influenza,inj,Quad PF,6+ Mos 10/06/2013, 12/04/2014, 08/27/2015, 09/11/2016, 10/08/2017, 11/13/2018, 09/23/2019, 09/07/2021   PFIZER(Purple Top)SARS-COV-2 Vaccination 02/23/2020, 03/24/2020, 09/23/2020   Pfizer Covid-19 Vaccine Bivalent Booster 3714yrs & up 09/07/2021   Pneumococcal Polysaccharide-23 11/30/2009, 11/13/2018   Tdap 10/08/2012   Zoster status:  declines   Mammogram : declines  Self breast exam : no lumps , wants exam   Gyn care/pap: declines  Menopausal   Still has hot flashes occ    Colon cancer screening: declines all   Smoking status : smokes 1/4 ppd  Getting closer to quit but not ready yet  Would be open to nictotine repl later   Breathing- occ cough/change of season / gets colds    BP Readings from Last 3 Encounters:  04/19/22 130/84  07/05/21 140/80  04/14/21 122/70   Pulse Readings from Last 3 Encounters:  04/19/22 85  07/05/21 (!) 119  04/14/21 92     GERD Omeprazole 20 mg daily  Does well as long as she takes medicine  Most foods flare it  Avoids spicy stuff  Does not like tomato  Red sauce makes it worse   Hypokalemia Lab Results  Component Value Date   CREATININE 0.84 04/07/2021   BUN 13 04/07/2021   NA 142 04/07/2021   K 4.4 04/07/2021   CL 104 04/07/2021   CO2 26 04/07/2021   KCL 20 meq daily  She missed some doses a while back/lost her px for a while  Now is back on it   Hyperlipidemia    Lab Results  Component Value Date   CHOL 193 04/07/2021   HDL 49.80 04/07/2021   LDLCALC 117 (H) 04/07/2021   LDLDIRECT 130.8 11/11/2013   TRIG 134.0 04/07/2021   CHOLHDL 4 04/07/2021   Simvastatin 40 mg daily  No missed doses   The 10-year ASCVD risk score (Arnett DK, et al., 2019) is: 6.9%   Values used to calculate the score:     Age: [age] years     Sex: Female     Is Non-Hispanic African American: No     Diabetic: No     Tobacco smoker: Yes     Systolic Blood Pressure: 130 mmHg     Is BP treated: No     HDL Cholesterol: 49.8 mg/dL     Total Cholesterol: 193 mg/dL   Diet is pretty good  Beef 3-4 times per week however  Not a lot of fried foods    Fibromyalgia and chronic fatigue  Tramadol and flexeril prn About the same  A little more sore than she used to be  Exercise -working with her dogs   Elevated glucose Lab Results  Component Value Date   HGBA1C 5.9 04/07/2021   Mental health Depression and anxiety  Sertraline 100 mg daily  Doing well overall  Some stress from training a puppy  Parents have heart problems Father has a stent  Mother has ICD ?    Patient Active Problem List   Diagnosis Date Noted   Routine general medical examination at a health care facility 11/13/2018   GERD (gastroesophageal reflux disease) 06/10/2018   Elevated glucose 03/04/2017   Primary osteoarthritis of first carpometacarpal joint of left hand 02/22/2016   HYPOKALEMIA 12/20/2009   Hyperlipidemia 11/30/2009   IBS 11/30/2009   TOBACCO USE 08/26/2008   Depression with anxiety 08/26/2008   Fibromyalgia 08/26/2008   Chronic fatigue 08/26/2008   Past Medical History:  Diagnosis Date   Carpal tunnel syndrome    Depression    Fibromyalgia    GERD (gastroesophageal reflux disease)    Hyperlipidemia    Hypokalemia    Pyelonephritis 2006   hsopoitalized   Tobacco abuse    Past Surgical History:  Procedure Laterality Date   HIP FRACTURE SURGERY  1995   left    LEG SURGERY  age 61   leg surgery with screws placed   Social History   Tobacco Use   Smoking status: Every Day    Packs/day: 0.25    Types: Cigarettes   Smokeless tobacco: Never  Substance Use Topics   Alcohol use: No    Alcohol/week: 0.0 standard drinks of alcohol   Drug use: Yes    Types: Marijuana    Comment: said uses it for pain   Family History  Problem Relation Age of Onset   Obesity Father    Arthritis Father    Heart disease Paternal Uncle        MI   Diabetes Maternal Grandmother    Heart disease Paternal Grandfather        MI   Cancer Cousin        leukemia   Allergies  Allergen Reactions   Diclofenac Sodium     REACTION: GI   Ibuprofen     REACTION: GI   Naproxen Sodium     REACTION: GI, and did not work   Rofecoxib     REACTION: GI   Current Outpatient Medications on File Prior to Visit  Medication Sig Dispense Refill   cyclobenzaprine (FLEXERIL) 10 MG tablet TAKE 1 TABLET BY MOUTH THREE TIMES A DAY AS NEEDED FOR MUSCLE SPASM 270 tablet 1   meclizine (ANTIVERT) 25 MG tablet TAKE 1 TABLET (25 MG TOTAL) BY MOUTH 3 (THREE) TIMES DAILY AS NEEDED FOR DIZZINESS. 30 tablet 0   Multiple Vitamin (MULTIVITAMIN) tablet Take 1 tablet by mouth daily.       Omega-3 Fatty Acids (FISH OIL PO) Take 1 capsule by mouth daily.     omeprazole (PRILOSEC) 20 MG capsule TAKE 1 CAPSULE BY MOUTH EVERY DAY 90 capsule 3   potassium chloride (MICRO-K) 10 MEQ CR capsule Take 2 capsules (20 mEq total) by mouth daily. 180 capsule 0   sertraline (ZOLOFT) 100 MG tablet TAKE 1.5 TABLETS BY MOUTH DAILY 135 tablet 3   simvastatin (ZOCOR) 20 MG tablet TAKE 2 TABLETS (40 MG TOTAL) BY MOUTH AT BEDTIME. 180 tablet 3   traMADol (ULTRAM) 50 MG tablet Take 1 tablet (50 mg total) by mouth 2 (two) times daily as needed. for pain 60 tablet 0   No current facility-administered medications on file prior to visit.      Review of Systems  Constitutional:  Positive for fatigue. Negative for  activity change, appetite change, fever and unexpected weight change.  HENT:  Negative for congestion, ear pain, rhinorrhea, sinus  pressure and sore throat.   Eyes:  Negative for pain, redness and visual disturbance.  Respiratory:  Negative for cough, shortness of breath and wheezing.   Cardiovascular:  Negative for chest pain and palpitations.  Gastrointestinal:  Negative for abdominal pain, blood in stool, constipation and diarrhea.  Endocrine: Negative for polydipsia and polyuria.  Genitourinary:  Negative for dysuria, frequency and urgency.  Musculoskeletal:  Positive for arthralgias, back pain and myalgias. Negative for joint swelling.  Skin:  Negative for pallor and rash.  Allergic/Immunologic: Negative for environmental allergies.  Neurological:  Negative for dizziness, syncope and headaches.  Hematological:  Negative for adenopathy. Does not bruise/bleed easily.  Psychiatric/Behavioral:  Positive for dysphoric mood. Negative for decreased concentration. The patient is not nervous/anxious.        Objective:   Physical Exam Constitutional:      General: She is not in acute distress.    Appearance: Normal appearance. She is well-developed and normal weight. She is not ill-appearing or diaphoretic.  HENT:     Head: Normocephalic and atraumatic.     Right Ear: Tympanic membrane, ear canal and external ear normal.     Left Ear: Tympanic membrane, ear canal and external ear normal.     Nose: Nose normal. No congestion.     Mouth/Throat:     Mouth: Mucous membranes are moist.     Pharynx: Oropharynx is clear. No posterior oropharyngeal erythema.  Eyes:     General: No scleral icterus.    Extraocular Movements: Extraocular movements intact.     Conjunctiva/sclera: Conjunctivae normal.     Pupils: Pupils are equal, round, and reactive to light.  Neck:     Thyroid: No thyromegaly.     Vascular: No carotid bruit or JVD.  Cardiovascular:     Rate and Rhythm: Normal rate and regular  rhythm.     Pulses: Normal pulses.     Heart sounds: Normal heart sounds.     No gallop.  Pulmonary:     Effort: Pulmonary effort is normal. No respiratory distress.     Breath sounds: Normal breath sounds. No wheezing.     Comments: Diffusely distant bs  Chest:     Chest wall: No tenderness.  Abdominal:     General: Bowel sounds are normal. There is no distension or abdominal bruit.     Palpations: Abdomen is soft. There is no mass.     Tenderness: There is no abdominal tenderness.     Hernia: No hernia is present.  Genitourinary:    Comments: Breast exam: No mass, nodules, thickening, tenderness, bulging, retraction, inflamation, nipple discharge or skin changes noted.  No axillary or clavicular LA.     Musculoskeletal:        General: No tenderness. Normal range of motion.     Cervical back: Normal range of motion and neck supple. No rigidity. No muscular tenderness.     Right lower leg: No edema.     Left lower leg: No edema.     Comments: No kyphosis   Lymphadenopathy:     Cervical: No cervical adenopathy.  Skin:    General: Skin is warm and dry.     Coloration: Skin is not pale.     Findings: No erythema or rash.     Comments: Solar lentigines diffusely   Neurological:     Mental Status: She is alert. Mental status is at baseline.     Cranial Nerves: No cranial nerve deficit.     Motor:  No abnormal muscle tone.     Coordination: Coordination normal.     Gait: Gait normal.     Deep Tendon Reflexes: Reflexes are normal and symmetric. Reflexes normal.  Psychiatric:        Mood and Affect: Mood normal.        Cognition and Memory: Cognition and memory normal.           Assessment & Plan:   Problem List Items Addressed This Visit       Digestive   GERD (gastroesophageal reflux disease)    Omeprazole 20 mg daily /has not been able to tolerate being off of it  Aware of foods that flare her        Other   Depression with anxiety    Continues sertraline 100  mg daily  No longer takes xanax Reviewed stressors/ coping techniques/symptoms/ support sources/ tx options and side effects in detail today   Chronic fatigue and fibromyalgia play a role  Enc self care        Elevated glucose    a1c ordered  disc imp of low glycemic diet and wt loss to prevent DM2       Relevant Orders   Hemoglobin A1c (Completed)   Fibromyalgia    Stable to slightly worse  This limits activity  Encouraged her to do low impact exercise       Hyperlipidemia    Disc goals for lipids and reasons to control them Rev last labs with pt Rev low sat fat diet in detail  Plan to continue simvastatin  Lab today        Relevant Orders   Lipid panel (Completed)   Comprehensive metabolic panel (Completed)   Routine general medical examination at a health care facility - Primary    Reviewed health habits including diet and exercise and skin cancer prevention Reviewed appropriate screening tests for age  Also reviewed health mt list, fam hx and immunization status , as well as social and family history   See HPI Labs reviewed  Declines shingrix vaccine  Declines mammograms and gyn exam/pap Declines all colon cancer screening  Not ready to quit smoking May be interested in CT lung cancer screening, given info and will let us know  Has risk factors for CAD, offered visit with cardiology to discuss, declines for now        Relevant Orders   TSH (Completed)   Lipid panel (Completed)   Comprehensive metabolic panel (Completed)   CBC with Differential/Platelet (Completed)   TOBACCO USE    Disc in detail risks of smoking and possible outcomes including copd, vascular/ heart disease, cancer , respiratory and sinus infections  Pt voices understanding  Pt is not ready to quit  Offered help when she is

## 2022-04-20 NOTE — Assessment & Plan Note (Signed)
Reviewed health habits including diet and exercise and skin cancer prevention Reviewed appropriate screening tests for age  Also reviewed health mt list, fam hx and immunization status , as well as social and family history   See HPI Labs reviewed  Declines shingrix vaccine  Declines mammograms and gyn exam/pap Declines all colon cancer screening  Not ready to quit smoking May be interested in CT lung cancer screening, given info and will let us know  Has risk factors for CAD, offered visit with cardiology to discuss, declines for now

## 2022-04-20 NOTE — Assessment & Plan Note (Signed)
Continues sertraline 100 mg daily  No longer takes xanax Reviewed stressors/ coping techniques/symptoms/ support sources/ tx options and side effects in detail today   Chronic fatigue and fibromyalgia play a role  Enc self care

## 2022-04-20 NOTE — Assessment & Plan Note (Signed)
a1c ordered  disc imp of low glycemic diet and wt loss to prevent DM2  

## 2022-04-20 NOTE — Assessment & Plan Note (Signed)
Stable to slightly worse  This limits activity  Encouraged her to do low impact exercise

## 2022-04-20 NOTE — Assessment & Plan Note (Signed)
Omeprazole 20 mg daily /has not been able to tolerate being off of it  Aware of foods that flare her

## 2022-04-20 NOTE — Assessment & Plan Note (Signed)
Disc in detail risks of smoking and possible outcomes including copd, vascular/ heart disease, cancer , respiratory and sinus infections  Pt voices understanding  Pt is not ready to quit  Offered help when she is

## 2022-04-20 NOTE — Assessment & Plan Note (Signed)
Disc goals for lipids and reasons to control them Rev last labs with pt Rev low sat fat diet in detail  Plan to continue simvastatin  Lab today

## 2022-04-29 ENCOUNTER — Other Ambulatory Visit: Payer: Self-pay | Admitting: Family Medicine

## 2022-05-03 ENCOUNTER — Other Ambulatory Visit: Payer: Self-pay | Admitting: Family Medicine

## 2022-05-03 NOTE — Telephone Encounter (Signed)
Last filled 03/31/22 Last ov 04/19/22

## 2022-06-06 ENCOUNTER — Other Ambulatory Visit: Payer: Self-pay | Admitting: Family Medicine

## 2022-06-06 NOTE — Telephone Encounter (Signed)
Last filled 05/03/22 tramadol Last ov06/14/23  Last filled 03/15/22 zoloft

## 2022-06-14 ENCOUNTER — Other Ambulatory Visit: Payer: Self-pay | Admitting: Family Medicine

## 2022-07-12 ENCOUNTER — Other Ambulatory Visit: Payer: Self-pay | Admitting: Family Medicine

## 2022-07-13 NOTE — Telephone Encounter (Signed)
Name of Medication: Tramadol Name of Pharmacy: [facility] Last Fill or Written Date and Quantity: 06/06/22 #60/ 0 refills  Last Office Visit and Type: CPE on 04/19/22 Next Office Visit and Type: none scheduled

## 2022-07-30 ENCOUNTER — Other Ambulatory Visit: Payer: Self-pay | Admitting: Family Medicine

## 2022-07-31 NOTE — Telephone Encounter (Signed)
CPE was on 04/19/22, last filled on 02/01/22 #270 tabs with 1 refill

## 2022-08-17 ENCOUNTER — Other Ambulatory Visit: Payer: Self-pay | Admitting: Family Medicine

## 2022-08-18 NOTE — Telephone Encounter (Signed)
Name of Medication: Tramadol Name of Pharmacy: [facility] [place] or Written Date and Quantity: 07/13/22 #60/ 0 refills  Last Office Visit and Type: CPE on 04/19/22 Next Office Visit and Type: none scheduled

## 2022-09-12 DIAGNOSIS — Z23 Encounter for immunization: Secondary | ICD-10-CM | POA: Diagnosis not present

## 2022-09-25 ENCOUNTER — Other Ambulatory Visit: Payer: Self-pay | Admitting: Family

## 2022-10-02 NOTE — Telephone Encounter (Signed)
[facility] Night - Client TELEPHONE ADVICE RECORD [facility] Patient Name: [facility] Gender: Female DOB: 1961/09/11 Age: 61 Y 6 M 17 D Return Phone Number: [phone] (Primary) Address: City/ State/ Zip: [place]  [address] Client [facility] Night - Client Client Site [facility] - Night Provider Tower, Idamae Schuller - MD Contact Type Call Who Is Calling Patient / Member / Family / Caregiver Call Type Triage / Clinical Relationship To Patient Self Return Phone Number [phone] (Primary) Chief Complaint Prescription Refill or Medication Request (non symptomatic) Reason for Call Medication Question / Request Initial Comment Caller pharmacy told her to contact the office for a refill for Tramodol. Caller states the dr has not returned they called to have rx sent. Translation No Nurse Assessment Nurse: Noe Gens, RN, Archie Patten Date/Time (Eastern Time): 09/27/2022 7:15:17 PM Confirm and document reason for call. If symptomatic, describe symptoms. ---Caller states her joints are getting sore d/t not having her Tramadol. She took last dose this morning. Does the patient have any new or worsening symptoms? ---Yes Will a triage be completed? ---Yes Related visit to physician within the last 2 weeks? ---No Does the PT have any chronic conditions? (i.e. diabetes, asthma, this includes High risk factors for pregnancy, etc.) ---Yes List chronic conditions. ---OA, Fibromyalgia Is this a behavioral health or substance abuse call? ---No Nurse: Noe Gens, RN, Archie Patten Date/Time (Eastern Time): 09/27/2022 7:15:24 PM Please select the assessment type ---Request for controlled medication refill Additional Documentation ---Tramadol Is there an on-call physician for the client? ---Yes Do the client directives specifically allow for paging the on-call regarding scheduled drugs? ---No Additional Documentation ---Advised caller  to try office on Friday and if closed to call back on Monday to f/u on refill request PLEASE NOTE: All timestamps contained within this report are represented as [place] Standard Time. CONFIDENTIALTY NOTICE: This fax transmission is intended only for the addressee. It contains information that is legally privileged, confidential or otherwise protected from use or disclosure. If you are not the intended recipient, you are strictly prohibited from reviewing, disclosing, copying using or disseminating any of this information or taking any action in reliance on or regarding this information. If you have received this fax in error, please notify us immediately by telephone so that we can arrange for its return to [place]. Phone: [phone], Toll-Free: [phone], Fax: [phone] Page: 2 of 2 Call Id: [id number] Guidelines Guideline Title Affirmed Question Affirmed Notes Nurse Date/Time Lamount Cohen Time) Knee Pain Knee pain is a chronic symptom (recurrent or ongoing AND present > 4 weeks) Noe Gens, RN, [facility] 09/27/2022 7:17:14 PM Disp. Time Lamount Cohen Time) Disposition Final User 09/27/2022 7:21:07 PM See PCP within 2 Weeks Yes Noe Gens, RN, Archie Patten Final Disposition 09/27/2022 7:21:07 PM See PCP within 2 Weeks Yes Noe Gens, RN, Wannetta Sender Disagree/Comply Comply Caller Understands Yes PreDisposition Did not know what to do Care Advice Given Per Guideline SEE PCP WITHIN 2 WEEKS: * You need to be seen for this ongoing problem within the next 2 weeks. REST YOUR KNEE FOR THE NEXT COUPLE DAYS: * Avoid activities that worsen your pain. LOCAL HEAT: * Apply a warm washcloth or heating pad for 10 minutes three times daily. * ACETAMINOPHEN - REGULAR STRENGTH TYLENOL: Take 650 mg (two 325 mg pills) by mouth every 4 to 6 hours as needed. Each Regular Strength Tylenol pill has 325 mg of acetaminophen. The most you should take is 10 pills a day (3,250 mg total). Note: In [place],  the maximum is 12 pills a day (3,900  mg total). PAIN MEDICINES: * IBUPROFEN (E.G., MOTRIN, ADVIL): Take 400 mg (two 200 mg pills) by mouth every 6 hours. The most you should take is 6 pills a day (1,200 mg total). CALL BACK IF: * You become worse CARE ADVICE given per Knee Pain (Adult) guideline. Comments User: Juliette Alcide, RN Date/Time Lamount Cohen Time): 09/27/2022 7:23:03 PM Caller mentioned Knot on neck but declined triage for that Referrals REFERRED TO PCP OFFIC

## 2022-10-12 ENCOUNTER — Other Ambulatory Visit: Payer: Self-pay | Admitting: Family Medicine

## 2022-10-12 NOTE — Telephone Encounter (Signed)
CPE was on 04/19/22, last filled on 07/18/21 #30 tabs/ 0 refill

## 2022-11-09 ENCOUNTER — Other Ambulatory Visit: Payer: Self-pay | Admitting: Family Medicine

## 2022-11-10 NOTE — Telephone Encounter (Signed)
Name of Medication: Tramadol Name of Pharmacy: [facility] [place] or Written Date and Quantity: 10/02/22 #60 tabs/ 0 refills Last Office Visit and Type: CPE 04/19/22 Next Office Visit and Type: none scheduled

## 2022-12-28 ENCOUNTER — Other Ambulatory Visit: Payer: Self-pay | Admitting: Family Medicine

## 2022-12-29 NOTE — Telephone Encounter (Signed)
Name of Medication: Tramadol Name of [facility]acy[facility] or Written Date and Quantity: 11/10/22 #60 tabs/ 0 refills Last Office Visit and Type: CPE 04/19/22 Next Office Visit and Type: none schedule

## 2023-01-23 ENCOUNTER — Other Ambulatory Visit: Payer: Self-pay | Admitting: Family Medicine

## 2023-01-24 NOTE — Telephone Encounter (Signed)
Last filled on 07/31/22 #270 tabs with 1 refill, CPE was on 04/19/22

## 2023-01-27 ENCOUNTER — Other Ambulatory Visit: Payer: Self-pay | Admitting: Family Medicine

## 2023-01-30 ENCOUNTER — Other Ambulatory Visit: Payer: Self-pay | Admitting: Family Medicine

## 2023-01-30 NOTE — Telephone Encounter (Signed)
Name of Medication: Tramadol Name of Pharmacy: [facility] or Written Date and Quantity: 12/29/22 #60 tabs/ 0 refills Last Office Visit and Type: CPE 04/19/22 Next Office Visit and Type: none schedule

## 2023-03-04 ENCOUNTER — Other Ambulatory Visit: Payer: Self-pay | Admitting: Family Medicine

## 2023-03-05 NOTE — Telephone Encounter (Signed)
Schedule PE after mid June please

## 2023-03-05 NOTE — Telephone Encounter (Signed)
Name of Medication: Tramadol Name of Pharmacy: [facility] Last Fill or Written Date and Quantity: 01/30/23 #60 tabs/ 0 refills Last Office Visit and Type: CPE 04/19/22 Next Office Visit and Type: none schedule

## 2023-03-06 NOTE — Telephone Encounter (Signed)
Patient has been scheduled

## 2023-04-07 ENCOUNTER — Telehealth: Payer: Self-pay | Admitting: Family Medicine

## 2023-04-07 DIAGNOSIS — Z79899 Other long term (current) drug therapy: Secondary | ICD-10-CM

## 2023-04-07 DIAGNOSIS — R7309 Other abnormal glucose: Secondary | ICD-10-CM

## 2023-04-07 DIAGNOSIS — E7800 Pure hypercholesterolemia, unspecified: Secondary | ICD-10-CM

## 2023-04-07 DIAGNOSIS — R5382 Chronic fatigue, unspecified: Secondary | ICD-10-CM

## 2023-04-07 DIAGNOSIS — Z0000 Encounter for general adult medical examination without abnormal findings: Secondary | ICD-10-CM

## 2023-04-07 NOTE — Telephone Encounter (Signed)
-----   Message from Alvina Chou sent at 03/26/2023 11:55 AM EDT ----- Regarding: Lab orders for Monday, 6.3.24 Patient is scheduled for CPX labs, please order future labs, Thanks , Camelia Eng

## 2023-04-09 ENCOUNTER — Other Ambulatory Visit (INDEPENDENT_AMBULATORY_CARE_PROVIDER_SITE_OTHER): Payer: Federal, State, Local not specified - PPO

## 2023-04-09 DIAGNOSIS — Z79899 Other long term (current) drug therapy: Secondary | ICD-10-CM

## 2023-04-09 DIAGNOSIS — R5382 Chronic fatigue, unspecified: Secondary | ICD-10-CM | POA: Diagnosis not present

## 2023-04-09 DIAGNOSIS — R7309 Other abnormal glucose: Secondary | ICD-10-CM | POA: Diagnosis not present

## 2023-04-09 DIAGNOSIS — E7800 Pure hypercholesterolemia, unspecified: Secondary | ICD-10-CM | POA: Diagnosis not present

## 2023-04-09 DIAGNOSIS — Z0000 Encounter for general adult medical examination without abnormal findings: Secondary | ICD-10-CM

## 2023-04-09 LAB — COMPREHENSIVE METABOLIC PANEL
ALT: 26 U/L (ref 0–35)
AST: 25 U/L (ref 0–37)
Albumin: 4.5 g/dL (ref 3.5–5.2)
Alkaline Phosphatase: 75 U/L (ref 39–117)
BUN: 9 mg/dL (ref 6–23)
CO2: 28 mEq/L (ref 19–32)
Calcium: 9.5 mg/dL (ref 8.4–10.5)
Chloride: 104 mEq/L (ref 96–112)
Creatinine, Ser: 0.73 mg/dL (ref 0.40–1.20)
GFR: 89.6 mL/min (ref >60.00)
Glucose, Bld: 119 mg/dL — ABNORMAL HIGH (ref 70–99)
Potassium: 4.5 mEq/L (ref 3.5–5.1)
Sodium: 141 mEq/L (ref 135–145)
Total Bilirubin: 0.4 mg/dL (ref 0.2–1.2)
Total Protein: 6.9 g/dL (ref 6.0–8.3)

## 2023-04-09 LAB — CBC WITH DIFFERENTIAL/PLATELET
Basophils Absolute: 0 10*3/uL (ref 0.0–0.1)
Basophils Relative: 0.3 % (ref 0.0–3.0)
Eosinophils Absolute: 0.3 10*3/uL (ref 0.0–0.7)
Eosinophils Relative: 4.9 % (ref 0.0–5.0)
HCT: 43 % (ref 36.0–46.0)
Hemoglobin: 14.3 g/dL (ref 12.0–15.0)
Lymphocytes Relative: 31.2 % (ref 12.0–46.0)
Lymphs Abs: 2.1 10*3/uL (ref 0.7–4.0)
MCHC: 33.4 g/dL (ref 30.0–36.0)
MCV: 88.8 fl (ref 78.0–100.0)
Monocytes Absolute: 0.5 10*3/uL (ref 0.1–1.0)
Monocytes Relative: 7.9 % (ref 3.0–12.0)
Neutro Abs: 3.7 10*3/uL (ref 1.4–7.7)
Neutrophils Relative %: 55.7 % (ref 43.0–77.0)
Platelets: 257 10*3/uL (ref 150.0–400.0)
RBC: 4.84 Mil/uL (ref 3.87–5.11)
RDW: 13.4 % (ref 11.5–15.5)
WBC: 6.6 10*3/uL (ref 4.0–10.5)

## 2023-04-09 LAB — TSH: TSH: 3.42 u[IU]/mL (ref 0.35–5.50)

## 2023-04-09 LAB — LIPID PANEL
Cholesterol: 184 mg/dL (ref 0–200)
HDL: 53.7 mg/dL (ref >39.00)
LDL Cholesterol: 93 mg/dL (ref 0–99)
NonHDL: 130.27
Total CHOL/HDL Ratio: 3
Triglycerides: 184 mg/dL — ABNORMAL HIGH (ref 0.0–149.0)
VLDL: 36.8 mg/dL (ref 0.0–40.0)

## 2023-04-09 LAB — VITAMIN D 25 HYDROXY (VIT D DEFICIENCY, FRACTURES): VITD: 44.36 ng/mL (ref 30.00–100.00)

## 2023-04-09 LAB — HEMOGLOBIN A1C: Hgb A1c MFr Bld: 5.7 % (ref 4.6–6.5)

## 2023-04-09 LAB — VITAMIN B12: Vitamin B-12: 515 pg/mL (ref 211–911)

## 2023-04-12 ENCOUNTER — Other Ambulatory Visit: Payer: Self-pay | Admitting: Family Medicine

## 2023-04-12 NOTE — Telephone Encounter (Signed)
Name of Medication: Tramadol Name of Pharmacy: [facility] Last Fill or Written Date and Quantity: 03/05/23 #60 tabs/ 0 refills Last Office Visit and Type: CPE 04/19/22 Next Office Visit and Type: CPE 04/16/23

## 2023-04-16 ENCOUNTER — Encounter: Payer: Self-pay | Admitting: Family Medicine

## 2023-04-16 ENCOUNTER — Ambulatory Visit (INDEPENDENT_AMBULATORY_CARE_PROVIDER_SITE_OTHER): Payer: Federal, State, Local not specified - PPO | Admitting: Family Medicine

## 2023-04-16 VITALS — BP 118/76 | HR 78 | Temp 97.8°F | Ht 68.5 in | Wt 175.0 lb

## 2023-04-16 DIAGNOSIS — E7800 Pure hypercholesterolemia, unspecified: Secondary | ICD-10-CM

## 2023-04-16 DIAGNOSIS — F418 Other specified anxiety disorders: Secondary | ICD-10-CM

## 2023-04-16 DIAGNOSIS — Z23 Encounter for immunization: Secondary | ICD-10-CM | POA: Diagnosis not present

## 2023-04-16 DIAGNOSIS — K219 Gastro-esophageal reflux disease without esophagitis: Secondary | ICD-10-CM

## 2023-04-16 DIAGNOSIS — R7309 Other abnormal glucose: Secondary | ICD-10-CM

## 2023-04-16 DIAGNOSIS — F17200 Nicotine dependence, unspecified, uncomplicated: Secondary | ICD-10-CM

## 2023-04-16 DIAGNOSIS — M797 Fibromyalgia: Secondary | ICD-10-CM

## 2023-04-16 DIAGNOSIS — Z79899 Other long term (current) drug therapy: Secondary | ICD-10-CM

## 2023-04-16 DIAGNOSIS — Z0000 Encounter for general adult medical examination without abnormal findings: Secondary | ICD-10-CM | POA: Diagnosis not present

## 2023-04-16 DIAGNOSIS — F339 Major depressive disorder, recurrent, unspecified: Secondary | ICD-10-CM

## 2023-04-16 MED ORDER — CYCLOBENZAPRINE HCL 10 MG PO TABS: ORAL_TABLET | ORAL | 1 refills | Status: DC

## 2023-04-16 NOTE — Assessment & Plan Note (Signed)
Lab Results  Component Value Date   VITAMINB12 515 04/09/2023   Taking vit D and mvi

## 2023-04-16 NOTE — Assessment & Plan Note (Signed)
Vapes nicotine solution more than cigarettes now  No c/o breathing problems Not interested in lung cancer screening now   Not ready to quit

## 2023-04-16 NOTE — Assessment & Plan Note (Signed)
Lab Results  Component Value Date   HGBA1C 5.7 04/09/2023   disc imp of low glycemic diet and wt loss to prevent DM2

## 2023-04-16 NOTE — Assessment & Plan Note (Signed)
Stable to slightly worse  This limits activity  Encouraged her to do low impact exercise as tolerated  Recent death of father may add to this

## 2023-04-16 NOTE — Assessment & Plan Note (Signed)
Currently stable On sertraline 100 mg daily  In setting of chronic pain and grief

## 2023-04-16 NOTE — Progress Notes (Signed)
Subjective:    Patient ID: Angela Petty, female    DOB: [birth year], 62 y.o.   MRN: [id number]  HPI  Here for health maintenance exam and to review chronic medical problems   Wt Readings from Last 3 Encounters:  04/16/23 175 lb (79.4 kg)  04/19/22 178 lb 6 oz (80.9 kg)  07/05/21 175 lb 8 oz (79.6 kg)   26.22 kg/m  Vitals:   04/16/23 0804  BP: 118/76  Pulse: 78  Temp: 97.8 F (36.6 C)  SpO2: 100%    Immunization History  Administered Date(s) Administered   COVID-19, mRNA, vaccine(Comirnaty)12 years and older 09/12/2022   Influenza Split 10/18/2011, 10/08/2012   Influenza Whole 11/30/2009, 10/07/2010   Influenza,inj,Quad PF,6+ Mos 10/06/2013, 12/04/2014, 08/27/2015, 09/11/2016, 10/08/2017, 11/13/2018, 09/23/2019, 09/07/2021   PFIZER(Purple Top)SARS-COV-2 Vaccination 02/23/2020, 03/24/2020, 09/23/2020   Pfizer Covid-19 Vaccine Bivalent Booster 56yrs & up 09/07/2021   Pneumococcal Polysaccharide-23 11/30/2009, 11/13/2018   Td 04/16/2023   Tdap 10/08/2012    There are no preventive care reminders to display for this patient.   Her father died a month ago- unexpectedly - had diverticulitis  Then infection in heart valve  Doing fair with grief     More sore than she used to be   Shingrix- has had shingles already   Tetanus - will update today  Breast cancer screening - declines Pap - declines  Colon cancer screening - declines  Smoking status  Lung cancer screening  Vapes now mostly instead of smoking  1-2 cig per day   Less nicotine overall   BP Readings from Last 3 Encounters:  04/16/23 118/76  04/19/22 130/84  07/05/21 140/80     Falls-none  Fractures-none  Supplements  Exercise - MVI and vit D    Mood H/o depression and anxiety  Sertraline 100 mg daily  Chronic pain/ fibromyalgia and chronic fatigue affect mood     04/16/2023    8:08 AM 04/19/2022    8:29 AM 04/14/2021    9:28 AM 02/17/2020   10:22 AM 11/13/2018   10:11 AM  Depression  screen PHQ 2/9  Decreased Interest 0 0 0 0 0  Down, Depressed, Hopeless 0 0 0 0 0  PHQ - 2 Score 0 0 0 0 0  Altered sleeping 0 0 0 0   Tired, decreased energy 1 1 1  0   Change in appetite 0 0 0 0   Feeling bad or failure about yourself  0 0 0 0   Trouble concentrating 0 0 0 0   Moving slowly or fidgety/restless 0 0 0 0   Suicidal thoughts 0 0 0 0   PHQ-9 Score 1 1 1  0   Difficult doing work/chores Not difficult at all Not difficult at all Not difficult at all Not difficult at all   Doing ok with grief   Got through her court case with a neighbor -difficult     GERD Omeprazole 20 mg daily  Lab Results  Component Value Date   VITAMINB12 515 04/09/2023   Hyperlipidemia Lab Results  Component Value Date   CHOL 184 04/09/2023   CHOL 183 04/19/2022   CHOL 193 04/07/2021   Lab Results  Component Value Date   HDL 53.70 04/09/2023   HDL 56.40 04/19/2022   HDL 49.80 04/07/2021   Lab Results  Component Value Date   LDLCALC 93 04/09/2023   LDLCALC 100 (H) 04/19/2022   LDLCALC 117 (H) 04/07/2021   Lab Results  Component Value Date  TRIG 184.0 (H) 04/09/2023   TRIG 136.0 04/19/2022   TRIG 134.0 04/07/2021   Lab Results  Component Value Date   CHOLHDL 3 04/09/2023   CHOLHDL 3 04/19/2022   CHOLHDL 4 04/07/2021   Lab Results  Component Value Date   LDLDIRECT 130.8 11/11/2013   LDLDIRECT 142.9 10/06/2013   LDLDIRECT 134.9 10/18/2011    Simvastatin 40 mg daily   Glucose Lab Results  Component Value Date   HGBA1C 5.7 04/09/2023  Improved   Lab Results  Component Value Date   WBC 6.6 04/09/2023   HGB 14.3 04/09/2023   HCT 43.0 04/09/2023   MCV 88.8 04/09/2023   PLT 257.0 04/09/2023   CMP     Component Value Date/Time   NA 141 04/09/2023 0842   K 4.5 04/09/2023 0842   CL 104 04/09/2023 0842   CO2 28 04/09/2023 0842   GLUCOSE 119 (H) 04/09/2023 0842   BUN 9 04/09/2023 0842   CREATININE 0.73 04/09/2023 0842   CALCIUM 9.5 04/09/2023 0842   PROT 6.9  04/09/2023 0842   ALBUMIN 4.5 04/09/2023 0842   AST 25 04/09/2023 0842   ALT 26 04/09/2023 0842   ALKPHOS 75 04/09/2023 0842   BILITOT 0.4 04/09/2023 0842   GFR 89.60 04/09/2023 0842   GFRNONAA >60 08/22/2015 1059    Patient Active Problem List   Diagnosis Date Noted   Recurrent major depressive disorder, remission status unspecified (HCC) 04/16/2023   Current use of proton pump inhibitor 04/07/2023   Routine general medical examination at a health care facility 11/13/2018   GERD (gastroesophageal reflux disease) 06/10/2018   Elevated glucose 03/04/2017   Primary osteoarthritis of first carpometacarpal joint of left hand 02/22/2016   HYPOKALEMIA 12/20/2009   Hyperlipidemia 11/30/2009   IBS 11/30/2009   TOBACCO USE 08/26/2008   Depression with anxiety 08/26/2008   Fibromyalgia 08/26/2008   Chronic fatigue 08/26/2008   Past Medical History:  Diagnosis Date   Allergy    Anxiety    Arthritis    Carpal tunnel syndrome    Depression    Fibromyalgia    GERD (gastroesophageal reflux disease)    Hyperlipidemia    Hypokalemia    Pyelonephritis 11/06/2004   hsopoitalized   Tobacco abuse    Past Surgical History:  Procedure Laterality Date   FRACTURE SURGERY     HIP FRACTURE SURGERY  11/06/1993   left   LEG SURGERY  age 56   leg surgery with screws placed   Social History   Tobacco Use   Smoking status: Every Day    Packs/day: 0.25    Years: 40.00    Additional pack years: 0.00    Total pack years: 10.00    Types: Cigarettes   Smokeless tobacco: Never  Substance Use Topics   Alcohol use: No   Drug use: Yes    Types: Marijuana    Comment: said uses it for pain   Family History  Problem Relation Age of Onset   Obesity Father    Arthritis Father    Heart disease Father    Stroke Father    Heart disease Paternal Uncle        MI   Diabetes Maternal Grandmother    Heart disease Paternal Grandfather        MI   Diabetes Paternal Grandfather    Cancer Cousin         leukemia   Heart disease Mother    Obesity Mother    Stroke Mother  Arthritis Paternal Grandmother    Allergies  Allergen Reactions   Diclofenac Sodium     REACTION: GI   Ibuprofen     REACTION: GI   Naproxen Sodium     REACTION: GI, and did not work   Rofecoxib     REACTION: GI   Current Outpatient Medications on File Prior to Visit  Medication Sig Dispense Refill   meclizine (ANTIVERT) 25 MG tablet TAKE 1 TABLET BY MOUTH 3 TIMES DAILY AS NEEDED FOR DIZZINESS. 30 tablet 0   Multiple Vitamin (MULTIVITAMIN) tablet Take 1 tablet by mouth daily.       Omega-3 Fatty Acids (FISH OIL PO) Take 1 capsule by mouth daily.     omeprazole (PRILOSEC) 20 MG capsule TAKE 1 CAPSULE BY MOUTH EVERY DAY 90 capsule 3   potassium chloride (MICRO-K) 10 MEQ CR capsule TAKE 2 CAPSULES BY MOUTH DAILY. 180 capsule 2   sertraline (ZOLOFT) 100 MG tablet TAKE 1 AND 1/2 TABLETS BY MOUTH EVERY DAY 135 tablet 3   simvastatin (ZOCOR) 20 MG tablet TAKE 2 TABLETS BY MOUTH AT BEDTIME. 180 tablet 3   traMADol (ULTRAM) 50 MG tablet TAKE 1 TABLET BY MOUTH TWICE A DAY AS NEEDED FOR PAIN 60 tablet 0   No current facility-administered medications on file prior to visit.     Review of Systems  Constitutional:  Positive for fatigue. Negative for activity change, appetite change, fever and unexpected weight change.  HENT:  Negative for congestion, ear pain, rhinorrhea, sinus pressure and sore throat.   Eyes:  Negative for pain, redness and visual disturbance.  Respiratory:  Negative for cough, shortness of breath and wheezing.   Cardiovascular:  Negative for chest pain and palpitations.  Gastrointestinal:  Negative for abdominal pain, blood in stool, constipation and diarrhea.  Endocrine: Negative for polydipsia and polyuria.  Genitourinary:  Negative for dysuria, frequency and urgency.  Musculoskeletal:  Positive for arthralgias, back pain and myalgias.  Skin:  Negative for pallor and rash.   Allergic/Immunologic: Negative for environmental allergies.  Neurological:  Negative for dizziness, syncope and headaches.  Hematological:  Negative for adenopathy. Does not bruise/bleed easily.  Psychiatric/Behavioral:  Positive for dysphoric mood. Negative for decreased concentration. The patient is not nervous/anxious.        Objective:   Physical Exam Constitutional:      General: She is not in acute distress.    Appearance: Normal appearance. She is well-developed and normal weight. She is not ill-appearing or diaphoretic.  HENT:     Head: Normocephalic and atraumatic.     Right Ear: Tympanic membrane, ear canal and external ear normal.     Left Ear: Tympanic membrane, ear canal and external ear normal.     Nose: Nose normal. No congestion.     Mouth/Throat:     Mouth: Mucous membranes are moist.     Pharynx: Oropharynx is clear. No posterior oropharyngeal erythema.  Eyes:     General: No scleral icterus.    Extraocular Movements: Extraocular movements intact.     Conjunctiva/sclera: Conjunctivae normal.     Pupils: Pupils are equal, round, and reactive to light.  Neck:     Thyroid: No thyromegaly.     Vascular: No carotid bruit or JVD.  Cardiovascular:     Rate and Rhythm: Normal rate and regular rhythm.     Pulses: Normal pulses.     Heart sounds: Normal heart sounds.     No gallop.  Pulmonary:     Effort:  Pulmonary effort is normal. No respiratory distress.     Breath sounds: Normal breath sounds. No wheezing.     Comments: Good air exch Chest:     Chest wall: No tenderness.  Abdominal:     General: Bowel sounds are normal. There is no distension or abdominal bruit.     Palpations: Abdomen is soft. There is no mass.     Tenderness: There is no abdominal tenderness.     Hernia: No hernia is present.  Genitourinary:    Comments: Declines breast/ cervical cancer screenign Musculoskeletal:        General: No tenderness. Normal range of motion.     Cervical back:  Normal range of motion and neck supple. No rigidity. No muscular tenderness.     Right lower leg: No edema.     Left lower leg: No edema.     Comments: No kyphosis   Lymphadenopathy:     Cervical: No cervical adenopathy.  Skin:    General: Skin is warm and dry.     Coloration: Skin is not pale.     Findings: No erythema or rash.     Comments: Solar lentigines diffusely   Neurological:     Mental Status: She is alert. Mental status is at baseline.     Cranial Nerves: No cranial nerve deficit.     Motor: No abnormal muscle tone.     Coordination: Coordination normal.     Gait: Gait normal.     Deep Tendon Reflexes: Reflexes are normal and symmetric. Reflexes normal.  Psychiatric:        Attention and Perception: Attention normal.        Mood and Affect: Mood is depressed. Affect is tearful.        Cognition and Memory: Cognition and memory normal.     Comments: Candidly discusses symptoms and stressors   Tearful when discussing loss of father            Assessment & Plan:   Problem List Items Addressed This Visit       Digestive   GERD (gastroesophageal reflux disease)    Continues omeprazole 20 mg daily  Normal GFR Taking vit D B12 in normal range         Other   TOBACCO USE    Vapes nicotine solution more than cigarettes now  No c/o breathing problems Not interested in lung cancer screening now   Not ready to quit       Routine general medical examination at a health care facility - Primary    Reviewed health habits including diet and exercise and skin cancer prevention Reviewed appropriate screening tests for age  Also reviewed health mt list, fam hx and immunization status , as well as social and family history   See HPI Labs reviewed and ordered Discussed grief/ loss of father May re consider shingrix immunization Td given today  Declines cancer screening (breast and colon and cervix) Taking mvi and D (increase risk of osteoporosis due to  smoking) PHQ score of 1 , stable       Recurrent major depressive disorder, remission status unspecified (HCC)    Currently stable On sertraline 100 mg daily  In setting of chronic pain and grief       Hyperlipidemia    Disc goals for lipids and reasons to control them Rev last labs with pt Rev low sat fat diet in detail  Controlled with simvastatin 40 mg daily  LDL under 100  today      Fibromyalgia    Stable to slightly worse  This limits activity  Encouraged her to do low impact exercise as tolerated  Recent death of father may add to this       Relevant Medications   cyclobenzaprine (FLEXERIL) 10 MG tablet   Elevated glucose    Lab Results  Component Value Date   HGBA1C 5.7 04/09/2023  disc imp of low glycemic diet and wt loss to prevent DM2       Depression with anxiety    Continues sertraline 100 mg daily  Chronic pain and grief affect mood also  PHQ of 1 =overall stable   Encouraged good self care       Current use of proton pump inhibitor    Lab Results  Component Value Date   VITAMINB12 515 04/09/2023  Taking vit D and mvi      Other Visit Diagnoses     Need for Td vaccine       Relevant Orders   Td : Tetanus/diphtheria >7yo Preservative  free (Completed)

## 2023-04-16 NOTE — Assessment & Plan Note (Signed)
Disc goals for lipids and reasons to control them Rev last labs with pt Rev low sat fat diet in detail  Controlled with simvastatin 40 mg daily  LDL under 100 today

## 2023-04-16 NOTE — Assessment & Plan Note (Signed)
Reviewed health habits including diet and exercise and skin cancer prevention Reviewed appropriate screening tests for age  Also reviewed health mt list, fam hx and immunization status , as well as social and family history   See HPI Labs reviewed and ordered Discussed grief/ loss of father May re consider shingrix immunization Td given today  Declines cancer screening (breast and colon and cervix) Taking mvi and D (increase risk of osteoporosis due to smoking) PHQ score of 1 , stable

## 2023-04-16 NOTE — Assessment & Plan Note (Signed)
Continues sertraline 100 mg daily  Chronic pain and grief affect mood also  PHQ of 1 =overall stable   Encouraged good self care

## 2023-04-16 NOTE — Patient Instructions (Addendum)
If you are interested in the shingles vaccine series (Shingrix), call your insurance or pharmacy to check on coverage and location it must be given.  If affordable - you can schedule it here or at your pharmacy depending on coverage   Get at leas 2000 iu vitamin D daily   If you want to get connected with a counselor at any point for grief or stress let us know   To prevent diabetes  Try to get most of your carbohydrates from produce (with the exception of white potatoes)  Eat less bread/pasta/rice/snack foods/cereals/sweets and other items from the middle of the grocery store (processed carbs)  Be as active as you can    Tetanus shot today

## 2023-04-16 NOTE — Assessment & Plan Note (Signed)
Continues omeprazole 20 mg daily  Normal GFR Taking vit D B12 in normal range

## 2023-05-22 ENCOUNTER — Other Ambulatory Visit: Payer: Self-pay | Admitting: Family Medicine

## 2023-05-27 ENCOUNTER — Other Ambulatory Visit: Payer: Self-pay | Admitting: Family Medicine

## 2023-05-28 ENCOUNTER — Other Ambulatory Visit: Payer: Self-pay | Admitting: Family Medicine

## 2023-05-29 NOTE — Telephone Encounter (Signed)
Name of Medication: Tramadol Name of Pharmacy: [facility] Last Fill or Written Date and Quantity: 04/12/23 #60 tabs/ 0 refills Last Office Visit and Type: CPE 04/16/23 Next Office Visit and Type: none scheduled

## 2023-06-14 ENCOUNTER — Other Ambulatory Visit: Payer: Self-pay | Admitting: Family Medicine

## 2023-07-01 ENCOUNTER — Other Ambulatory Visit: Payer: Self-pay | Admitting: Family Medicine

## 2023-07-03 NOTE — Telephone Encounter (Signed)
Name of Medication: Tramadol Name of Pharmacy: [facility] Last Fill or Written Date and Quantity: 05/29/23 #60 tabs/ 0 refills Last Office Visit and Type: CPE 04/16/23 Next Office Visit and Type: none scheduled

## 2023-08-10 ENCOUNTER — Other Ambulatory Visit: Payer: Self-pay | Admitting: Family Medicine

## 2023-08-10 DIAGNOSIS — Z1211 Encounter for screening for malignant neoplasm of colon: Secondary | ICD-10-CM

## 2023-08-10 DIAGNOSIS — Z1212 Encounter for screening for malignant neoplasm of rectum: Secondary | ICD-10-CM

## 2023-08-26 ENCOUNTER — Other Ambulatory Visit: Payer: Self-pay | Admitting: Family Medicine

## 2023-08-27 NOTE — Telephone Encounter (Signed)
Refill request for TRAMADOL HCL 50 MG TABLET   LOV - 04/16/23 Next OV - not scheduled Last refill - 07/03/23 #60/0

## 2023-09-06 LAB — COLOGUARD: COLOGUARD: NEGATIVE

## 2023-09-14 ENCOUNTER — Other Ambulatory Visit: Payer: Self-pay | Admitting: Family Medicine

## 2023-09-14 NOTE — Telephone Encounter (Signed)
Last filled on 10/12/22 #30 tab/ 0 refills, CPE was on 04/16/23

## 2023-10-10 ENCOUNTER — Other Ambulatory Visit: Payer: Self-pay | Admitting: Family Medicine

## 2023-11-20 ENCOUNTER — Other Ambulatory Visit: Payer: Self-pay | Admitting: Family Medicine

## 2023-11-21 NOTE — Telephone Encounter (Signed)
 Name of Medication: Tramadol  Name of Pharmacy: [facility] Last [place] or Written Date and Quantity: 10/11/23 # 60 tabs/ 0 refill Last Office Visit and Type: 04/16/23 CPE Next Office Visit and Type: none scheduled

## 2024-02-11 ENCOUNTER — Other Ambulatory Visit: Payer: Self-pay | Admitting: Family Medicine

## 2024-02-11 NOTE — Telephone Encounter (Signed)
 I refilled once  Please schedule annual exam after June 10

## 2024-02-11 NOTE — Telephone Encounter (Signed)
 Name of Medication: Tramadol Name of Pharmacy: [facility] Last [place] or Written Date and Quantity: 11/21/23 # 60 tabs/ 0 refill Last Office Visit and Type: 04/16/23 CPE Next Office Visit and Type: none scheduled  Other meds are also due soon

## 2024-02-12 NOTE — Telephone Encounter (Signed)
 Patient has been scheduled

## 2024-02-12 NOTE — Telephone Encounter (Signed)
 Lvmtcb, sent mychart message

## 2024-03-09 ENCOUNTER — Other Ambulatory Visit: Payer: Self-pay | Admitting: Family Medicine

## 2024-04-03 ENCOUNTER — Other Ambulatory Visit: Payer: Self-pay | Admitting: Family Medicine

## 2024-04-04 NOTE — Telephone Encounter (Signed)
 Last filled on 10/11/23 270 tabs/ 1 refill  CPE scheduled 04/23/24

## 2024-04-14 ENCOUNTER — Telehealth: Payer: Self-pay | Admitting: Family Medicine

## 2024-04-14 DIAGNOSIS — R7303 Prediabetes: Secondary | ICD-10-CM

## 2024-04-14 DIAGNOSIS — Z0000 Encounter for general adult medical examination without abnormal findings: Secondary | ICD-10-CM

## 2024-04-14 DIAGNOSIS — Z79899 Other long term (current) drug therapy: Secondary | ICD-10-CM

## 2024-04-14 DIAGNOSIS — R5382 Chronic fatigue, unspecified: Secondary | ICD-10-CM

## 2024-04-14 NOTE — Telephone Encounter (Signed)
-----   Message from Gerry Krone sent at 04/01/2024  3:24 PM EDT ----- Regarding: Lab orders for Wed, 6.11.25 Patient is scheduled for CPX labs, please order future labs, Thanks , Anselmo Kings

## 2024-04-15 ENCOUNTER — Encounter: Payer: Self-pay | Admitting: Family Medicine

## 2024-04-15 NOTE — Telephone Encounter (Signed)
 That is a new problem-needs eval with first available  Please triage if needed

## 2024-04-16 ENCOUNTER — Other Ambulatory Visit (INDEPENDENT_AMBULATORY_CARE_PROVIDER_SITE_OTHER)

## 2024-04-16 ENCOUNTER — Ambulatory Visit: Payer: Self-pay | Admitting: Family Medicine

## 2024-04-16 DIAGNOSIS — R7303 Prediabetes: Secondary | ICD-10-CM | POA: Diagnosis not present

## 2024-04-16 DIAGNOSIS — R5382 Chronic fatigue, unspecified: Secondary | ICD-10-CM

## 2024-04-16 DIAGNOSIS — Z79899 Other long term (current) drug therapy: Secondary | ICD-10-CM

## 2024-04-16 DIAGNOSIS — Z0000 Encounter for general adult medical examination without abnormal findings: Secondary | ICD-10-CM | POA: Diagnosis not present

## 2024-04-16 NOTE — Addendum Note (Signed)
 Addended by: Gerry Krone on: 04/16/2024 07:49 AM   Modules accepted: Orders

## 2024-04-16 NOTE — Telephone Encounter (Signed)
 lvm for pt to call office to schedule appt.

## 2024-04-17 LAB — CBC WITH DIFFERENTIAL/PLATELET
Absolute Lymphocytes: 2475 {cells}/uL (ref 850–3900)
Absolute Monocytes: 584 {cells}/uL (ref 200–950)
Basophils Absolute: 44 {cells}/uL (ref 0–200)
Basophils Relative: 0.6 %
Eosinophils Absolute: 402 {cells}/uL (ref 15–500)
Eosinophils Relative: 5.5 %
HCT: 42.2 % (ref 35.0–45.0)
Hemoglobin: 14.1 g/dL (ref 11.7–15.5)
MCH: 29.8 pg (ref 27.0–33.0)
MCHC: 33.4 g/dL (ref 32.0–36.0)
MCV: 89.2 fL (ref 80.0–100.0)
MPV: 9.5 fL (ref 7.5–12.5)
Monocytes Relative: 8 %
Neutro Abs: 3796 {cells}/uL (ref 1500–7800)
Neutrophils Relative %: 52 %
Platelets: 253 10*3/uL (ref 140–400)
RBC: 4.73 10*6/uL (ref 3.80–5.10)
RDW: 12.4 % (ref 11.0–15.0)
Total Lymphocyte: 33.9 %
WBC: 7.3 10*3/uL (ref 3.8–10.8)

## 2024-04-17 LAB — COMPREHENSIVE METABOLIC PANEL WITH GFR
AG Ratio: 1.9 (calc) (ref 1.0–2.5)
ALT: 32 U/L — ABNORMAL HIGH (ref 6–29)
AST: 26 U/L (ref 10–35)
Albumin: 4.2 g/dL (ref 3.6–5.1)
Alkaline phosphatase (APISO): 79 U/L (ref 37–153)
BUN: 11 mg/dL (ref 7–25)
CO2: 30 mmol/L (ref 20–32)
Calcium: 9.4 mg/dL (ref 8.6–10.4)
Chloride: 104 mmol/L (ref 98–110)
Creat: 0.78 mg/dL (ref 0.50–1.05)
Globulin: 2.2 g/dL (ref 1.9–3.7)
Glucose, Bld: 105 mg/dL — ABNORMAL HIGH (ref 65–99)
Potassium: 4.8 mmol/L (ref 3.5–5.3)
Sodium: 142 mmol/L (ref 135–146)
Total Bilirubin: 0.4 mg/dL (ref 0.2–1.2)
Total Protein: 6.4 g/dL (ref 6.1–8.1)
eGFR: 86 mL/min/{1.73_m2} (ref >=60)

## 2024-04-17 LAB — TSH: TSH: 3.37 m[IU]/L (ref 0.40–4.50)

## 2024-04-17 LAB — LIPID PANEL
Cholesterol: 181 mg/dL (ref <200)
HDL: 59 mg/dL (ref >=50)
LDL Cholesterol (Calc): 99 mg/dL
Non-HDL Cholesterol (Calc): 122 mg/dL (ref <130)
Total CHOL/HDL Ratio: 3.1 (calc) (ref <5.0)
Triglycerides: 133 mg/dL (ref <150)

## 2024-04-17 LAB — VITAMIN B12: Vitamin B-12: 662 pg/mL (ref 200–1100)

## 2024-04-17 LAB — HEMOGLOBIN A1C
Hgb A1c MFr Bld: 6 % — ABNORMAL HIGH (ref <5.7)
Mean Plasma Glucose: 126 mg/dL
eAG (mmol/L): 7 mmol/L

## 2024-04-17 LAB — VITAMIN D 25 HYDROXY (VIT D DEFICIENCY, FRACTURES): Vit D, 25-Hydroxy: 76 ng/mL (ref 30–100)

## 2024-04-18 NOTE — Telephone Encounter (Signed)
 Called pt to schedule appt and pt stated I will be there on Wednesday so ill hold off until then

## 2024-04-23 ENCOUNTER — Other Ambulatory Visit (HOSPITAL_COMMUNITY)
Admission: RE | Admit: 2024-04-23 | Discharge: 2024-04-23 | Disposition: A | Discharge status: Home / Self Care | Source: Ambulatory Visit | Attending: Family Medicine | Admitting: Family Medicine

## 2024-04-23 ENCOUNTER — Encounter: Payer: Self-pay | Admitting: Family Medicine

## 2024-04-23 ENCOUNTER — Ambulatory Visit: Admitting: Family Medicine

## 2024-04-23 VITALS — BP 118/70 | HR 98 | Temp 98.1°F | Ht 68.5 in | Wt 178.2 lb

## 2024-04-23 DIAGNOSIS — Z0000 Encounter for general adult medical examination without abnormal findings: Secondary | ICD-10-CM | POA: Diagnosis not present

## 2024-04-23 DIAGNOSIS — Z79899 Other long term (current) drug therapy: Secondary | ICD-10-CM | POA: Diagnosis not present

## 2024-04-23 DIAGNOSIS — R7303 Prediabetes: Secondary | ICD-10-CM

## 2024-04-23 DIAGNOSIS — M797 Fibromyalgia: Secondary | ICD-10-CM

## 2024-04-23 DIAGNOSIS — N952 Postmenopausal atrophic vaginitis: Secondary | ICD-10-CM

## 2024-04-23 DIAGNOSIS — F418 Other specified anxiety disorders: Secondary | ICD-10-CM

## 2024-04-23 DIAGNOSIS — F17200 Nicotine dependence, unspecified, uncomplicated: Secondary | ICD-10-CM

## 2024-04-23 DIAGNOSIS — R7309 Other abnormal glucose: Secondary | ICD-10-CM

## 2024-04-23 DIAGNOSIS — Z01419 Encounter for gynecological examination (general) (routine) without abnormal findings: Secondary | ICD-10-CM | POA: Diagnosis not present

## 2024-04-23 DIAGNOSIS — E7800 Pure hypercholesterolemia, unspecified: Secondary | ICD-10-CM

## 2024-04-23 DIAGNOSIS — K219 Gastro-esophageal reflux disease without esophagitis: Secondary | ICD-10-CM

## 2024-04-23 MED ORDER — ESTRADIOL 0.1 MG/GM VA CREA: TOPICAL_CREAM | VAGINAL | 1 refills | Status: AC

## 2024-04-23 NOTE — Assessment & Plan Note (Signed)
 Smoking and vaping nicotine Intent to quit  Disc in detail risks of smoking and possible outcomes including copd, vascular/ heart disease, cancer , respiratory and sinus infections as well as osteoporosis  Pt voices understanding

## 2024-04-23 NOTE — Assessment & Plan Note (Signed)
 105 fasting  disc imp of low glycemic diet and wt loss to prevent DM2

## 2024-04-23 NOTE — Assessment & Plan Note (Signed)
 Lab Results  Component Value Date   HGBA1C 6.0 (H) 04/16/2024   HGBA1C 5.7 04/09/2023   HGBA1C 5.8 04/19/2022   Continue to monitor disc imp of low glycemic diet and wt loss to prevent DM2

## 2024-04-23 NOTE — Assessment & Plan Note (Signed)
 May be worsening vaginal discomfort and urinary frequency Prescription estrace cream to try pea sized amt twice weeklyu

## 2024-04-23 NOTE — Assessment & Plan Note (Signed)
 Stable  Tramadol  and flexeril   Continues sertraline   This limits activity  Encouraged her to do low impact exercise as tolerated  Recent death of father may add to this

## 2024-04-23 NOTE — Assessment & Plan Note (Signed)
 Reviewed health habits including diet and exercise and skin cancer prevention Reviewed appropriate screening tests for age  Also reviewed health mt list, fam hx and immunization status , as well as social and family history   See HPI Labs reviewed and ordered Health Maintenance  Topic Date Due   Pap with HPV screening  Never done   Hepatitis C Screening  04/23/2025*   HIV Screening  04/23/2025*   COVID-19 Vaccine (7 - Pfizer risk 2024-25 season) 05/09/2025*   Zoster (Shingles) Vaccine (1 of 2) 07/24/2026*   Mammogram  04/21/2027*   Flu Shot  06/06/2024   Cologuard (Stool DNA test)  08/29/2026   DTaP/Tdap/Td vaccine (3 - Td or Tdap) 04/15/2033   Pneumococcal Vaccination  Completed   HPV Vaccine  Aged Out   Meningitis B Vaccine  Aged Out  *Topic was postponed. The date shown is not the original due date.    Pt plans to check on coverage of shingrix  Considering quitting smok/vap nicotine Gyn exam done Declines breast cancer screening  Cologuard utd  PHQ stable

## 2024-04-23 NOTE — Assessment & Plan Note (Signed)
 Disc goals for lipids and reasons to control them Rev last labs with pt Rev low sat fat diet in detail Some improvement today   Continues simvastatin  40 mg daily

## 2024-04-23 NOTE — Assessment & Plan Note (Signed)
 Continues sertraline  100 mg daily  Chronic pain and grief affect mood also  PHQ is stable  Encouraged good self care

## 2024-04-23 NOTE — Assessment & Plan Note (Signed)
 Lab Results  Component Value Date   VITAMINB12 662 04/16/2024   Last vitamin D  Lab Results  Component Value Date   VD25OH 76 04/16/2024

## 2024-04-23 NOTE — Patient Instructions (Addendum)
 If you are interested in the shingles vaccine series (Shingrix), call your insurance or pharmacy to check on coverage and location it must be given.  If affordable - you can schedule it here or at your pharmacy depending on coverage   Stay active Add some strength training to your routine, this is important for bone and brain health and can reduce your risk of falls and help your body use insulin properly and regulate weight  Light weights, exercise bands , and internet videos are a good way to start  Yoga (chair or regular), machines , floor exercises or a gym with machines are also good options    Try to get most of your carbohydrates from produce (with the exception of white potatoes) and whole grains Eat less bread/pasta/rice/snack foods/cereals/sweets and other items from the middle of the grocery store (processed carbs)  Keep thinking about quitting smoking /vaping   Pap will come back within the next week   Try the estrace vaginal cream twice weekly  This may help vaginal and urinary symptoms   If not, come back and we will continue to evaluate   Read about the lung cancer screening program and let us  know if you are interested

## 2024-04-23 NOTE — Assessment & Plan Note (Signed)
Continues omeprazole 20 mg daily  Normal GFR Taking vit D B12 in normal range

## 2024-04-23 NOTE — Progress Notes (Signed)
 Subjective:    Patient ID: Angela Petty, female    DOB: 1961/05/13, 63 y.o.   MRN: [id number]  HPI  Here for health maintenance exam and to review chronic medical problems   Wt Readings from Last 3 Encounters:  04/23/24 178 lb 4 oz (80.9 kg)  04/16/23 175 lb (79.4 kg)  04/19/22 178 lb 6 oz (80.9 kg)   26.71 kg/m  Vitals:   04/23/24 0823  BP: 118/70  Pulse: 98  Temp: 98.1 F (36.7 C)  SpO2: 96%    Immunization History  Administered Date(s) Administered   Influenza Split 10/18/2011, 10/08/2012   Influenza Whole 11/30/2009, 10/07/2010   Influenza, Seasonal, Injecte, Preservative Fre 08/31/2023   Influenza,inj,Quad PF,6+ Mos 10/06/2013, 12/04/2014, 08/27/2015, 09/11/2016, 10/08/2017, 11/13/2018, 09/23/2019, 09/07/2021   PFIZER(Purple Top)SARS-COV-2 Vaccination 02/23/2020, 03/24/2020, 09/23/2020   PNEUMOCOCCAL CONJUGATE-20 08/31/2023   Pfizer Covid-19 Vaccine Bivalent Booster 43yrs & up 09/07/2021   Pfizer(Comirnaty)Fall Seasonal Vaccine 12 years and older 09/12/2022, 08/31/2023   Pneumococcal Polysaccharide-23 11/30/2009, 11/13/2018   Td 04/16/2023   Tdap 10/08/2012    Health Maintenance Due  Topic Date Due   Cervical Cancer Screening (HPV/Pap Cotest)  Never done   Declines hep C or HIV screening until she knows if ins covers it  Low risk    Mammogram- declines  Self breast exam-no lumps   Gyn health Interested in a pap declined in the past)  Occational has some sharp pains vaginally  No vaginal discharge  Not sexually active  Does not want STD screening   Colon cancer screening - cologuard 08/2023 negative   Bone health   Falls-none Fractures-none  Supplements  D  Last vitamin D  Lab Results  Component Value Date   VD25OH 76 04/16/2024    Exercise  Difficult with chronic pain  Is active - at home   Smoking status  Has been vaping - nicotine /unsure how much , at least 10 times  Cig down to 2-3 per week   Mood    04/16/2023    8:08 AM  04/19/2022    8:29 AM 04/14/2021    9:28 AM 02/17/2020   10:22 AM 11/13/2018   10:11 AM  Depression screen PHQ 2/9  Decreased Interest 0 0 0 0 0  Down, Depressed, Hopeless 0 0 0 0 0  PHQ - 2 Score 0 0 0 0 0  Altered sleeping 0 0 0 0   Tired, decreased energy 1 1 1  0   Change in appetite 0 0 0 0   Feeling bad or failure about yourself  0 0 0 0   Trouble concentrating 0 0 0 0   Moving slowly or fidgety/restless 0 0 0 0   Suicidal thoughts 0 0 0 0   PHQ-9 Score 1 1 1  0   Difficult doing work/chores Not difficult at all Not difficult at all Not difficult at all Not difficult at all     Depression /anxiety Sertraline  100 mg daily   Also fibromyalgia  Tramadol  Flexeril    GERD Omeprazole  20 mg   Lab Results  Component Value Date   VITAMINB12 662 04/16/2024    Hyperlipidemia Lab Results  Component Value Date   CHOL 181 04/16/2024   CHOL 184 04/09/2023   CHOL 183 04/19/2022   Lab Results  Component Value Date   HDL 59 04/16/2024   HDL 53.70 04/09/2023   HDL 56.40 04/19/2022   Lab Results  Component Value Date   LDLCALC 99 04/16/2024   LDLCALC 93  04/09/2023   LDLCALC 100 (H) 04/19/2022   Lab Results  Component Value Date   TRIG 133 04/16/2024   TRIG 184.0 (H) 04/09/2023   TRIG 136.0 04/19/2022   Lab Results  Component Value Date   CHOLHDL 3.1 04/16/2024   CHOLHDL 3 04/09/2023   CHOLHDL 3 04/19/2022   Lab Results  Component Value Date   LDLDIRECT 130.8 11/11/2013   LDLDIRECT 142.9 10/06/2013   LDLDIRECT 134.9 10/18/2011   Simvastatin  40 mg daily   Trying to eat better  Lab Results  Component Value Date   NA 142 04/16/2024   K 4.8 04/16/2024   CO2 30 04/16/2024   GLUCOSE 105 (H) 04/16/2024   BUN 11 04/16/2024   CREATININE 0.78 04/16/2024   CALCIUM 9.4 04/16/2024   GFR 89.60 04/09/2023   EGFR 86 04/16/2024   GFRNONAA >60 08/22/2015   Lab Results  Component Value Date   ALT 32 (H) 04/16/2024   AST 26 04/16/2024   ALKPHOS 75 04/09/2023    BILITOT 0.4 04/16/2024   Lab Results  Component Value Date   WBC 7.3 04/16/2024   HGB 14.1 04/16/2024   HCT 42.2 04/16/2024   MCV 89.2 04/16/2024   PLT 253 04/16/2024   Lab Results  Component Value Date   TSH 3.37 04/16/2024    Prediabetes Lab Results  Component Value Date   HGBA1C 6.0 (H) 04/16/2024   HGBA1C 5.7 04/09/2023   HGBA1C 5.8 04/19/2022   Eats too much ice cream   Patient Active Problem List   Diagnosis Date Noted   Elevated glucose 04/23/2024   Encounter for annual routine gynecological examination 04/23/2024   Recurrent major depressive disorder, remission status unspecified (HCC) 04/16/2023   Current use of proton pump inhibitor 04/07/2023   Routine general medical examination at a health care facility 11/13/2018   GERD (gastroesophageal reflux disease) 06/10/2018   Prediabetes 03/04/2017   Primary osteoarthritis of first carpometacarpal joint of left hand 02/22/2016   HYPOKALEMIA 12/20/2009   Hyperlipidemia 11/30/2009   IBS 11/30/2009   TOBACCO USE 08/26/2008   Depression with anxiety 08/26/2008   Fibromyalgia 08/26/2008   Chronic fatigue 08/26/2008   Past Medical History:  Diagnosis Date   Allergy    Anxiety    Arthritis    Carpal tunnel syndrome    Depression    Fibromyalgia    GERD (gastroesophageal reflux disease)    Hyperlipidemia    Hypokalemia    Pyelonephritis 11/06/2004   hsopoitalized   Tobacco abuse    Past Surgical History:  Procedure Laterality Date   FRACTURE SURGERY     HIP FRACTURE SURGERY  11/06/1993   left   LEG SURGERY  age 22   leg surgery with screws placed   Social History   Tobacco Use   Smoking status: Some Days    Current packs/day: 0.25    Average packs/day: 0.3 packs/day for 46.7 years (12.0 ttl pk-yrs)    Types: Cigarettes   Smokeless tobacco: Never  Substance Use Topics   Alcohol use: No   Drug use: Yes    Types: Marijuana    Comment: said uses it for pain   Family History  Problem Relation Age  of Onset   Obesity Father    Arthritis Father    Heart disease Father    Stroke Father    Heart disease Paternal Uncle        MI   Diabetes Maternal Grandmother    Heart disease Paternal Grandfather  MI   Diabetes Paternal Grandfather    Cancer Cousin        leukemia   Heart disease Mother    Obesity Mother    Stroke Mother    Arthritis Paternal Grandmother    Allergies  Allergen Reactions   Diclofenac  Sodium     REACTION: GI   Ibuprofen     REACTION: GI   Naproxen Sodium     REACTION: GI, and did not work   Rofecoxib     REACTION: GI   Current Outpatient Medications on File Prior to Visit  Medication Sig Dispense Refill   cyclobenzaprine  (FLEXERIL ) 10 MG tablet TAKE 1 TABLET BY MOUTH THREE TIMES A DAY AS NEEDED FOR MUSCLE SPASM 270 tablet 0   meclizine  (ANTIVERT ) 25 MG tablet TAKE 1 TABLET BY MOUTH 3 TIMES A DAY AS NEEDED FOR DIZZINESS 30 tablet 0   Multiple Vitamin (MULTIVITAMIN) tablet Take 1 tablet by mouth daily.       Omega-3 Fatty Acids (FISH OIL PO) Take 1 capsule by mouth daily.     omeprazole  (PRILOSEC) 20 MG capsule TAKE 1 CAPSULE BY MOUTH EVERY DAY 90 capsule 0   potassium chloride  (MICRO-K ) 10 MEQ CR capsule TAKE 2 CAPSULES BY MOUTH EVERY DAY 180 capsule 0   sertraline  (ZOLOFT ) 100 MG tablet TAKE 1 AND 1/2 TABLETS DAILY BY MOUTH 135 tablet 0   simvastatin  (ZOCOR ) 20 MG tablet TAKE 2 TABLETS BY MOUTH AT BEDTIME 180 tablet 0   traMADol  (ULTRAM ) 50 MG tablet TAKE 1 TABLET BY MOUTH TWICE A DAY AS NEEDED FOR PAIN 60 tablet 0   No current facility-administered medications on file prior to visit.    Review of Systems  Constitutional:  Positive for fatigue. Negative for activity change, appetite change, fever and unexpected weight change.  HENT:  Negative for congestion, ear pain, rhinorrhea, sinus pressure and sore throat.   Eyes:  Negative for pain, redness and visual disturbance.  Respiratory:  Negative for cough, shortness of breath and wheezing.    Cardiovascular:  Negative for chest pain and palpitations.  Gastrointestinal:  Negative for abdominal pain, blood in stool, constipation and diarrhea.  Endocrine: Negative for polydipsia and polyuria.  Genitourinary:  Negative for dysuria, frequency and urgency.  Musculoskeletal:  Positive for arthralgias and myalgias. Negative for back pain.  Skin:  Negative for pallor and rash.  Allergic/Immunologic: Negative for environmental allergies.  Neurological:  Negative for dizziness, syncope and headaches.  Hematological:  Negative for adenopathy. Does not bruise/bleed easily.  Psychiatric/Behavioral:  Negative for decreased concentration and dysphoric mood. The patient is not nervous/anxious.        Mood is fair        Objective:   Physical Exam Constitutional:      General: She is not in acute distress.    Appearance: Normal appearance. She is well-developed and normal weight. She is not ill-appearing or diaphoretic.  HENT:     Head: Normocephalic and atraumatic.     Right Ear: Tympanic membrane, ear canal and external ear normal.     Left Ear: Tympanic membrane, ear canal and external ear normal.     Nose: Nose normal. No congestion.     Mouth/Throat:     Mouth: Mucous membranes are moist.     Pharynx: Oropharynx is clear. No posterior oropharyngeal erythema.   Eyes:     General: No scleral icterus.    Extraocular Movements: Extraocular movements intact.     Conjunctiva/sclera: Conjunctivae normal.  Pupils: Pupils are equal, round, and reactive to light.   Neck:     Thyroid: No thyromegaly.     Vascular: No carotid bruit or JVD.   Cardiovascular:     Rate and Rhythm: Normal rate and regular rhythm.     Pulses: Normal pulses.     Heart sounds: Normal heart sounds.     No gallop.  Pulmonary:     Effort: Pulmonary effort is normal. No respiratory distress.     Breath sounds: Normal breath sounds. No wheezing.     Comments: Diffusely distant bs  Chest:     Chest wall:  No tenderness.  Abdominal:     General: Bowel sounds are normal. There is no distension or abdominal bruit.     Palpations: Abdomen is soft. There is no mass.     Tenderness: There is no abdominal tenderness.     Hernia: No hernia is present.  Genitourinary:    Comments: Breast exam: No mass, nodules, thickening, tenderness, bulging, retraction, inflamation, nipple discharge or skin changes noted.  No axillary or clavicular LA.                   Anus appears normal w/o hemorrhoids or masses       External genitalia : nl appearance and hair distribution/no lesions       Urethral meatus : nl size, no lesions or prolapse       Urethra: no masses, tenderness or scarring      Bladder : no masses or tenderness       Vagina: nl general appearance, no discharge or  Lesions, no significant cystocele  or rectocele , dryness/atrophy noted   some discomfort with speculum exam       Cervix:-unable to fully visualize due to discomfort when fully opening speculum  no lesions/ discharge or friability      Uterus: nl size, contour, position, and mobility (not fixed) , non tender      Adnexa : no masses, tenderness, enlargement or nodularity           Musculoskeletal:        General: No tenderness. Normal range of motion.     Cervical back: Normal range of motion and neck supple. No rigidity. No muscular tenderness.     Right lower leg: No edema.     Left lower leg: No edema.     Comments: No kyphosis   Lymphadenopathy:     Cervical: No cervical adenopathy.   Skin:    General: Skin is warm and dry.     Coloration: Skin is not pale.     Findings: No erythema or rash.     Comments: Solar lentigines diffusely    Neurological:     Mental Status: She is alert. Mental status is at baseline.     Cranial Nerves: No cranial nerve deficit.     Motor: No abnormal muscle tone.     Coordination: Coordination normal.     Gait: Gait normal.     Deep Tendon Reflexes: Reflexes are normal and symmetric.  Reflexes normal.   Psychiatric:        Mood and Affect: Mood normal.        Cognition and Memory: Cognition and memory normal.           Assessment & Plan:   Problem List Items Addressed This Visit       Digestive   GERD (gastroesophageal reflux disease)   Continues omeprazole  20  mg daily  Normal GFR Taking vit D B12 in normal range         Other   TOBACCO USE   Smoking and vaping nicotine Intent to quit  Disc in detail risks of smoking and possible outcomes including copd, vascular/ heart disease, cancer , respiratory and sinus infections as well as osteoporosis  Pt voices understanding       Routine general medical examination at a health care facility - Primary   Reviewed health habits including diet and exercise and skin cancer prevention Reviewed appropriate screening tests for age  Also reviewed health mt list, fam hx and immunization status , as well as social and family history   See HPI Labs reviewed and ordered Health Maintenance  Topic Date Due   Pap with HPV screening  Never done   Hepatitis C Screening  04/23/2025*   HIV Screening  04/23/2025*   COVID-19 Vaccine (7 - Pfizer risk 2024-25 season) 05/09/2025*   Zoster (Shingles) Vaccine (1 of 2) 07/24/2026*   Mammogram  04/21/2027*   Flu Shot  06/06/2024   Cologuard (Stool DNA test)  08/29/2026   DTaP/Tdap/Td vaccine (3 - Td or Tdap) 04/15/2033   Pneumococcal Vaccination  Completed   HPV Vaccine  Aged Out   Meningitis B Vaccine  Aged Out  *Topic was postponed. The date shown is not the original due date.    Pt plans to check on coverage of shingrix  Considering quitting smok/vap nicotine Gyn exam done Declines breast cancer screening  Cologuard utd  PHQ stable        Prediabetes   Lab Results  Component Value Date   HGBA1C 6.0 (H) 04/16/2024   HGBA1C 5.7 04/09/2023   HGBA1C 5.8 04/19/2022   Continue to monitor disc imp of low glycemic diet and wt loss to prevent DM2        Hyperlipidemia   Disc goals for lipids and reasons to control them Rev last labs with pt Rev low sat fat diet in detail Some improvement today   Continues simvastatin  40 mg daily      Fibromyalgia   Stable  Tramadol  and flexeril   Continues sertraline   This limits activity  Encouraged her to do low impact exercise as tolerated  Recent death of father may add to this        Encounter for annual routine gynecological examination   Routine exam and pap today  Unable to get good view of cervix due to discomfort on exam Not sexually active-declines std screen  Suspect vaginal atrophy/dryness Prescription estrace vag cream to use twice weekly (pea sized amt)   Encouraged follow up if this does not help vaginal/urinary symptoms       Relevant Orders   Cytology - PAP([place])   Elevated glucose   105 fasting  disc imp of low glycemic diet and wt loss to prevent DM2       Depression with anxiety   Continues sertraline  100 mg daily  Chronic pain and grief affect mood also  PHQ is stable  Encouraged good self care       Current use of proton pump inhibitor   Lab Results  Component Value Date   VITAMINB12 662 04/16/2024   Last vitamin D  Lab Results  Component Value Date   VD25OH 76 04/16/2024

## 2024-04-23 NOTE — Assessment & Plan Note (Signed)
 Routine exam and pap today  Unable to get good view of cervix due to discomfort on exam Not sexually active-declines std screen  Suspect vaginal atrophy/dryness Prescription estrace vag cream to use twice weekly (pea sized amt)   Encouraged follow up if this does not help vaginal/urinary symptoms

## 2024-04-24 ENCOUNTER — Ambulatory Visit: Payer: Self-pay | Admitting: Family Medicine

## 2024-04-24 LAB — CYTOLOGY - PAP
Comment: NEGATIVE
Diagnosis: NEGATIVE
High risk HPV: NEGATIVE

## 2024-04-30 ENCOUNTER — Other Ambulatory Visit: Payer: Self-pay | Admitting: Family Medicine

## 2024-04-30 NOTE — Telephone Encounter (Signed)
 Name of Medication: Tramadol  Name of Pharmacy: [facility] Last [place] or Written Date and Quantity: 02/11/24 # 60 tabs/ 0 refill Last Office Visit and Type: 04/23/24 CPE Next Office Visit and Type: none scheduled

## 2024-05-27 ENCOUNTER — Other Ambulatory Visit: Payer: Self-pay | Admitting: Family Medicine

## 2024-06-10 ENCOUNTER — Other Ambulatory Visit: Payer: Self-pay | Admitting: Family Medicine

## 2024-07-03 ENCOUNTER — Other Ambulatory Visit: Payer: Self-pay | Admitting: Family Medicine

## 2024-07-03 NOTE — Telephone Encounter (Signed)
 Name of Medication: Tramadol  Name of Pharmacy: [facility] Last [place] or Written Date and Quantity: 04/30/24 # 60 tabs/ 0 refill Last Office Visit and Type: 04/23/24 CPE Next Office Visit and Type: none scheduled

## 2024-07-17 ENCOUNTER — Other Ambulatory Visit: Payer: Self-pay | Admitting: Family Medicine

## 2024-07-18 NOTE — Telephone Encounter (Signed)
 Last filled on 04/04/24 #270 tab/ 0 refill  CPE 04/23/24

## 2024-09-11 ENCOUNTER — Other Ambulatory Visit: Payer: Self-pay | Admitting: Family Medicine

## 2024-09-12 NOTE — Telephone Encounter (Signed)
 Medication: Tramadol  Directions: TAKE 1 TABLET BY MOUTH TWICE A DAY AS NEEDED FOR PAIN  Last given: 07/03/2024 Number refills: 0 Last o/v: 04/23/2024 Follow up:  Labs:

## 2024-10-23 ENCOUNTER — Other Ambulatory Visit: Payer: Self-pay | Admitting: Family Medicine

## 2024-10-23 NOTE — Telephone Encounter (Signed)
 Name of Medication: Tramadol  Name of Pharmacy: [facility] Last [place] or Written Date and Quantity: 09/12/24 # 60 tabs/ 0 refill Last Office Visit and Type: 04/23/24 CPE Next Office Visit and Type: none scheduled

## 2024-12-12 ENCOUNTER — Other Ambulatory Visit: Payer: Self-pay | Admitting: Family Medicine

## 2024-12-12 NOTE — Telephone Encounter (Signed)
 Name of Medication: Tramadol  Name of Pharmacy: [facility] Last [place] or Written Date and Quantity: 10/23/24 # 60 tabs/ 0 refill Last Office Visit and Type: 04/23/24 CPE Next Office Visit and Type: none scheduled
